# Patient Record
Sex: Male | Born: 1975
Health system: Southern US, Community
[De-identification: ages and names within clinical notes are randomized; demographics above are authoritative.]

## PROBLEM LIST (undated history)

## (undated) DIAGNOSIS — D649 Anemia, unspecified: Secondary | ICD-10-CM

## (undated) DIAGNOSIS — E785 Hyperlipidemia, unspecified: Secondary | ICD-10-CM

## (undated) DIAGNOSIS — D509 Iron deficiency anemia, unspecified: Secondary | ICD-10-CM

## (undated) DIAGNOSIS — I10 Essential (primary) hypertension: Secondary | ICD-10-CM

## (undated) DIAGNOSIS — E119 Type 2 diabetes mellitus without complications: Secondary | ICD-10-CM

## (undated) HISTORY — DX: Iron deficiency anemia, unspecified: D50.9

## (undated) HISTORY — DX: Anemia, unspecified: D64.9

## (undated) HISTORY — DX: Type 2 diabetes mellitus without complications: E11.9

## (undated) HISTORY — DX: Hyperlipidemia, unspecified: E78.5

## (undated) HISTORY — DX: Essential (primary) hypertension: I10

---

## 1997-10-29 ENCOUNTER — Emergency Department (HOSPITAL_COMMUNITY): Admission: EM | Admit: 1997-10-29 | Discharge: 1997-10-29 | Payer: Self-pay | Admitting: Emergency Medicine

## 2006-01-05 ENCOUNTER — Emergency Department (HOSPITAL_COMMUNITY): Admission: EM | Admit: 2006-01-05 | Discharge: 2006-01-06 | Payer: Self-pay | Admitting: Emergency Medicine

## 2006-01-13 ENCOUNTER — Ambulatory Visit (HOSPITAL_BASED_OUTPATIENT_CLINIC_OR_DEPARTMENT_OTHER): Admission: RE | Admit: 2006-01-13 | Discharge: 2006-01-13 | Payer: Self-pay | Admitting: Urology

## 2017-01-19 ENCOUNTER — Ambulatory Visit (INDEPENDENT_AMBULATORY_CARE_PROVIDER_SITE_OTHER): Payer: BLUE CROSS/BLUE SHIELD | Admitting: Family Medicine

## 2017-01-19 ENCOUNTER — Ambulatory Visit (INDEPENDENT_AMBULATORY_CARE_PROVIDER_SITE_OTHER): Payer: BLUE CROSS/BLUE SHIELD

## 2017-01-19 ENCOUNTER — Encounter: Payer: Self-pay | Admitting: Family Medicine

## 2017-01-19 VITALS — BP 138/82 | HR 108 | Temp 99.1°F | Resp 18 | Ht 63.78 in | Wt 157.2 lb

## 2017-01-19 DIAGNOSIS — Z23 Encounter for immunization: Secondary | ICD-10-CM

## 2017-01-19 DIAGNOSIS — E785 Hyperlipidemia, unspecified: Secondary | ICD-10-CM | POA: Diagnosis not present

## 2017-01-19 DIAGNOSIS — R079 Chest pain, unspecified: Secondary | ICD-10-CM

## 2017-01-19 DIAGNOSIS — I517 Cardiomegaly: Secondary | ICD-10-CM | POA: Diagnosis not present

## 2017-01-19 DIAGNOSIS — R011 Cardiac murmur, unspecified: Secondary | ICD-10-CM | POA: Diagnosis not present

## 2017-01-19 DIAGNOSIS — R030 Elevated blood-pressure reading, without diagnosis of hypertension: Secondary | ICD-10-CM | POA: Diagnosis not present

## 2017-01-19 DIAGNOSIS — R9431 Abnormal electrocardiogram [ECG] [EKG]: Secondary | ICD-10-CM

## 2017-01-19 NOTE — Patient Instructions (Addendum)
Walking or other form of low intensity exercise most days of the week with goal of 150 mins per week. If any chest pains with exercise, stop right away and be seen.  Cardiology can also advise you on type and intensity of exercise  Exercise will also help cholesterol, but I will check those levels today.   I hear a faint heart murmur, and there were some slight abnormalities of your EKG, but may be normal for you. I will refer you to a cardiologist to see if other testing is recommended.   Return to the clinic or go to the nearest emergency room if any of your symptoms worsen or new symptoms occur.    Nonspecific Chest Pain Chest pain can be caused by many different conditions. There is always a chance that your pain could be related to something serious, such as a heart attack or a blood clot in your lungs. Chest pain can also be caused by conditions that are not life-threatening. If you have chest pain, it is very important to follow up with your health care provider. What are the causes? Causes of this condition include:  Heartburn.  Pneumonia or bronchitis.  Anxiety or stress.  Inflammation around your heart (pericarditis) or lung (pleuritis or pleurisy).  A blood clot in your lung.  A collapsed lung (pneumothorax). This can develop suddenly on its own (spontaneous pneumothorax) or from trauma to the chest.  Shingles infection (varicella-zoster virus).  Heart attack.  Damage to the bones, muscles, and cartilage that make up your chest wall. This can include: ? Bruised bones due to injury. ? Strained muscles or cartilage due to frequent or repeated coughing or overwork. ? Fracture to one or more ribs. ? Sore cartilage due to inflammation (costochondritis).  What increases the risk? Risk factors for this condition may include:  Activities that increase your risk for trauma or injury to your chest.  Respiratory infections or conditions that cause frequent  coughing.  Medical conditions or overeating that can cause heartburn.  Heart disease or family history of heart disease.  Conditions or health behaviors that increase your risk of developing a blood clot.  Having had chicken pox (varicella zoster).  What are the signs or symptoms? Chest pain can feel like:  Burning or tingling on the surface of your chest or deep in your chest.  Crushing, pressure, aching, or squeezing pain.  Dull or sharp pain that is worse when you move, cough, or take a deep breath.  Pain that is also felt in your back, neck, shoulder, or arm, or pain that spreads to any of these areas.  Your chest pain may come and go, or it may stay constant. How is this diagnosed? Lab tests or other studies may be needed to find the cause of your pain. Your health care provider may have you take a test called an ECG (electrocardiogram). An ECG records your heartbeat patterns at the time the test is performed. You may also have other tests, such as:  Transthoracic echocardiogram (TTE). In this test, sound waves are used to create a picture of the heart structures and to look at how blood flows through your heart.  Transesophageal echocardiogram (TEE).This is a more advanced imaging test that takes images from inside your body. It allows your health care provider to see your heart in finer detail.  Cardiac monitoring. This allows your health care provider to monitor your heart rate and rhythm in real time.  Holter monitor. This is a  portable device that records your heartbeat and can help to diagnose abnormal heartbeats. It allows your health care provider to track your heart activity for several days, if needed.  Stress tests. These can be done through exercise or by taking medicine that makes your heart beat more quickly.  Blood tests.  Other imaging tests.  How is this treated? Treatment depends on what is causing your chest pain. Treatment may include:  Medicines.  These may include: ? Acid blockers for heartburn. ? Anti-inflammatory medicine. ? Pain medicine for inflammatory conditions. ? Antibiotic medicine, if an infection is present. ? Medicines to dissolve blood clots. ? Medicines to treat coronary artery disease (CAD).  Supportive care for conditions that do not require medicines. This may include: ? Resting. ? Applying heat or cold packs to injured areas. ? Limiting activities until pain decreases.  Follow these instructions at home: Medicines  If you were prescribed an antibiotic, take it as told by your health care provider. Do not stop taking the antibiotic even if you start to feel better.  Take over-the-counter and prescription medicines only as told by your health care provider. Lifestyle  Do not use any products that contain nicotine or tobacco, such as cigarettes and e-cigarettes. If you need help quitting, ask your health care provider.  Do not drink alcohol.  Make lifestyle changes as directed by your health care provider. These may include: ? Getting regular exercise. Ask your health care provider to suggest some activities that are safe for you. ? Eating a heart-healthy diet. A registered dietitian can help you to learn healthy eating options. ? Maintaining a healthy weight. ? Managing diabetes, if necessary. ? Reducing stress, such as with yoga or relaxation techniques. General instructions  Avoid any activities that bring on chest pain.  If heartburn is the cause for your chest pain, raise (elevate) the head of your bed about 6 inches (15 cm) by putting blocks under the legs. Sleeping with more pillows does not effectively relieve heartburn because it only changes the position of your head.  Keep all follow-up visits as told by your health care provider. This is important. This includes any further testing if your chest pain does not go away. Contact a health care provider if:  Your chest pain does not go  away.  You have a rash with blisters on your chest.  You have a fever.  You have chills. Get help right away if:  Your chest pain is worse.  You have a cough that gets worse, or you cough up blood.  You have severe pain in your abdomen.  You have severe weakness.  You faint.  You have sudden, unexplained chest discomfort.  You have sudden, unexplained discomfort in your arms, back, neck, or jaw.  You have shortness of breath at any time.  You suddenly start to sweat, or your skin gets clammy.  You feel nauseous or you vomit.  You suddenly feel light-headed or dizzy.  Your heart begins to beat quickly, or it feels like it is skipping beats. These symptoms may represent a serious problem that is an emergency. Do not wait to see if the symptoms will go away. Get medical help right away. Call your local emergency services (911 in the [place] Do not drive yourself to the hospital. This information is not intended to replace advice given to you by your health care provider. Make sure you discuss any questions you have with your health care provider. Document Released: 12/18/2004 Document Revised:  12/03/2015 Document Reviewed: 12/03/2015 Elsevier Interactive Patient Education  2017 [employer]  Heart Murmur A heart murmur is an extra sound that is caused by chaotic blood flow. The murmur can be heard as a "hum" or "whoosh" sound when blood flows through the heart. The heart has four areas called chambers. Valves separate the upper and lower chambers from each other (tricuspid valve and mitral valve) and separate the lower chambers of the heart from pathways that lead away from the heart (aortic valve and pulmonary valve). Normally, the valves open to let blood flow through or out of your heart, and then they shut to keep the blood from flowing backward. There are two types of heart murmurs:  Innocent murmurs. Most people with this type of heart murmur do not have a heart problem.  Many children have innocent heart murmurs. Your health care provider may suggest some basic testing to find out whether your murmur is an innocent murmur. If an innocent heart murmur is found, there is no need for further tests or treatment and no need to restrict activities or stop playing sports.  Abnormal murmurs. These types of murmurs can occur in children and adults. Abnormal murmurs may be a sign of a more serious heart condition, such as a heart defect present at birth (congenital defect) or heart valve disease.  What are the causes? This condition is caused by heart valves that are not working properly. In children, abnormal heart murmurs are typically caused by congenital defects. In adults, abnormal murmurs are usually from heart valve problems caused by disease, infection, or aging. Three types of heart valve defects can cause a murmur:  Regurgitation. This is when blood leaks back through the valve in the wrong direction.  Mitral valve prolapse. This is when the mitral valve of the heart has a loose flap and does not close tightly.  Stenosis. This is when a valve does not open enough and blocks blood flow.  This condition may also be caused by:  Pregnancy.  Fever.  Overactive thyroid gland.  Anemia.  Exercise.  Rapid growth spurts (in children).  What are the signs or symptoms? Innocent murmurs do not cause symptoms, and many people with abnormal murmurs may or may not have symptoms. If symptoms do develop, they may include:  Shortness of breath.  Blue coloring of the skin, especially on the fingertips.  Chest pain.  Palpitations, or feeling a fluttering or skipped heartbeat.  Fainting.  Persistent cough.  Getting tired much faster than expected.  Swelling in the abdomen, feet, or ankles.  How is this diagnosed? This condition may be diagnosed during a routine physical or other exam. If your health care provider hears a murmur with a stethoscope, he or she  will listen for:  Where the murmur is located in your heart.  How long the murmur lasts (duration).  When the murmur is heard during the heartbeat.  How loud the murmur is. This may help the health care provider figure out what is causing the murmur.  You may be referred to a heart specialist (cardiologist). You may also have other tests, including:  Electrocardiogram (ECG or EKG). This test measures the electrical activity of your heart.  Echocardiogram. This test uses high frequency sound waves to make pictures of your heart.  MRI or chest X-ray.  Cardiac catheterization. This test looks at blood flow through the heart.  For children and adults who have an abnormal heart murmur and want to stay active, it is  important to complete testing, review test results, and receive recommendations from your health care provider. If heart disease is present, it may not be safe to play or be active. How is this treated? Heart murmurs themselves do not need treatment. In some cases, a heart murmur may go away on its own. If an underlying problem or disease is causing the murmur, you may need treatment. If treatment is needed, it will depend on the type and severity of the disease or heart problem causing the murmur. Treatment may include:  Medicine.  Surgery.  Dietary and lifestyle changes.  Follow these instructions at home:  Talk with your health care provider before participating in sports or other activities that require a lot of effort and energy (are strenuous).  Learn as much as possible about your condition and any related diseases. Ask your health care provider if you may at risk for any medical emergencies.  Talk with your health care provider about what symptoms you should look out for.  It is up to you to get your test results. Ask your health care provider, or the department that is doing the test, when your results will be ready.  Keep all follow-up visits as told by your  health care provider. This is important. Contact a health care provider if:  You feel light-headed.  You are frequently short of breath.  You feel more tired than usual.  You are having a hard time keeping up with normal activities or fitness routines.  You have swelling in your ankles or feet.  You have chest pain.  You notice that your heart often beats irregularly.  You develop any new symptoms. Get help right away if:  You develop severe chest pain.  You are having trouble breathing.  You have fainting spells.  Your symptoms suddenly get worse. These symptoms may represent a serious problem that is an emergency. Do not wait to see if the symptoms will go away. Get medical help right away. Call your local emergency services (911 in the [place] Do not drive yourself to the hospital. Summary  Normally, the heart valves open to let blood flow through or out of your heart, and then they shut to keep the blood from flowing backward.  Heart murmur is caused by heart valves that are not working properly.  You may need treatment if an underlying problem or disease is causing the heart murmur. Treatment may include medicine, surgery, or dietary and lifestyle changes.  Talk with your health care provider before participating in sports or other activities that require a lot of effort and energy (are strenuous).  Talk with your health care provider about what symptoms you should watch out for. This information is not intended to replace advice given to you by your health care provider. Make sure you discuss any questions you have with your health care provider. Document Released: 04/17/2004 Document Revised: 02/27/2016 Document Reviewed: 02/27/2016 Elsevier Interactive Patient Education  2017 [employer]    IF you received an x-ray today, you will receive an invoice from [facility]. Please contact [facility] at [phone] with questions or concerns  regarding your invoice.   IF you received labwork today, you will receive an invoice from [facility]. Please contact [facility] at [phone] with questions or concerns regarding your invoice.   Our billing staff will not be able to assist you with questions regarding bills from these companies.  You will be contacted with the lab results as soon as they are available.  The fastest way to get your results is to activate your My Chart account. Instructions are located on the last page of this paperwork. If you have not heard from [place] regarding the results in 2 weeks, please contact this office.

## 2017-01-19 NOTE — Progress Notes (Signed)
Subjective:  By signing my name below, I, Essence Howell, attest that this documentation has been prepared under the direction and in the presence of Shade FloodJeffrey R Rhilyn Battle, MD Electronically Signed: Charline BillsEssence Howell, ED Scribe 01/19/2017 at 1:41 PM.   Patient ID: John Obrien, male    DOB: [birth year], 41 y.o.   MRN: [id number]  Chief Complaint  Patient presents with  . Establish Care   HPI John Obrien is a 41 y.o. male who presents to Primary Care at [facility] to establish care. Pt had a biometric screening at work in May with borderline cholesterol and BP; no previous treatment. Reports HAs once every 2 weeks which resolve with 2 ibuprofen within 2 hours. Does report consuming frozen foods most days for lunch. Reports consuming 3-4 mixed alcoholic beverages every other weekend. He is only getting exercise during 12 hour shifts at work but states he was walking for exercise in the Spring. Pt does report rare sharp pain in his central left chest with exercising that self-resolves but denies a family h/o heart disease. Denies cp at this time, light-headedness, dizziness. H/o kidney stone removal in 2007. No long-term medications.   Immunizations Declines flu vaccine. Last tetanus was in 2007 but plans to update it today.   HIV screening in September 2007, non-reactive. Denies new unprotected sexual partners since.  There are no active problems to display for this patient.  No past medical history on file. No past surgical history on file. No Known Allergies Prior to Admission medications   Not on File   Social History   Social History  . Marital status: Single    Spouse name: N/A  . Number of children: N/A  . Years of education: N/A   Occupational History  . Not on file.   Social History Main Topics  . Smoking status: Never Smoker  . Smokeless tobacco: Never Used  . Alcohol use Yes     Comment: sometimes   . Drug use: Unknown  . Sexual activity: Not on file   Other Topics  Concern  . Not on file   Social History Narrative  . No narrative on file   Review of Systems  Constitutional: Negative for fatigue and unexpected weight change.  Eyes: Negative for visual disturbance.  Respiratory: Negative for cough, chest tightness and shortness of breath.   Cardiovascular: Negative for chest pain, palpitations and leg swelling.  Gastrointestinal: Negative for abdominal pain and blood in stool.  Neurological: Positive for headaches. Negative for dizziness and light-headedness.      Objective:   Physical Exam  Constitutional: He is oriented to person, place, and time. He appears well-developed and well-nourished.  HENT:  Head: Normocephalic and atraumatic.  Eyes: Pupils are equal, round, and reactive to light. EOM are normal.  Neck: No JVD present. Carotid bruit is not present.  Cardiovascular: Normal rate and regular rhythm.   Murmur heard.  Systolic (L upper sternal border) murmur is present with a grade of 2/6  Pulmonary/Chest: Effort normal and breath sounds normal. He has no rales.  Musculoskeletal: He exhibits no edema.  Neurological: He is alert and oriented to person, place, and time.  Skin: Skin is warm and dry.  Psychiatric: He has a normal mood and affect.  Vitals reviewed.  Vitals:   01/19/17 1333  BP: 138/82  Pulse: (!) 108  Resp: 18  Temp: 99.1 F (37.3 C)  TempSrc: Oral  SpO2: 100%  Weight: 157 lb 3.2 oz (71.3 kg)  Height: 5'  3.78" (1.62 m)   Dg Chest 2 View  Result Date: 01/19/2017 CLINICAL DATA:  41 y/o  M; heart murmur, rule out cardiomegaly. EXAM: CHEST  2 VIEW COMPARISON:  None. FINDINGS: The heart size and mediastinal contours are within normal limits. Both lungs are clear. The visualized skeletal structures are unremarkable. IMPRESSION: Normal chest radiograph.  No cardiomegaly. Electronically Signed   By: Mitzi Hansen M.D.   On: 01/19/2017 14:36   EKG reading done by Shade Flood, MD: sinus rhythm nonspecific  and inverted T-waves V3-V6. No apparent acute findings otherwise. No prior EKG for comparison.     Assessment & Plan:   John Obrien is a 41 y.o. male Hyperlipidemia, unspecified hyperlipidemia type - Plan: Comprehensive metabolic panel, Lipid panel  - Reportedly mild hyperlipidemia on health screening. Specific numbers not available, will check lipid panel in office. If elevated, may need to repeat after 8 hours of fasting  Elevated blood pressure reading  -By history, but high normal in office. Continue to monitor outside of office and if remaining over 140/90, would consider medication  Need for Tdap vaccination - Plan: Tdap vaccine greater than or equal to 7yo IM  Heart murmur - Plan: EKG 12-Lead, DG Chest 2 View, Ambulatory referral to Cardiology Nonspecific abnormal electrocardiogram (ECG) (EKG) - Plan: DG Chest 2 View, Ambulatory referral to CardiologyChest pain, unspecified type - Plan: EKG 12-Lead, DG Chest 2 View, Ambulatory referral to Cardiology  -Fleeting sharp chest pains, unlikely cardiac. Suspected MSK source. However, with borderline abnormal EKG, heart murmur, will refer to cardiology for further evaluation. ER/RTC precautions for chest pain and handout given.  No orders of the defined types were placed in this encounter.  Patient Instructions   Walking or other form of low intensity exercise most days of the week with goal of 150 mins per week. If any chest pains with exercise, stop right away and be seen.  Cardiology can also advise you on type and intensity of exercise  Exercise will also help cholesterol, but I will check those levels today.   I hear a faint heart murmur, and there were some slight abnormalities of your EKG, but may be normal for you. I will refer you to a cardiologist to see if other testing is recommended.   Return to the clinic or go to the nearest emergency room if any of your symptoms worsen or new symptoms occur.    Nonspecific Chest  Pain Chest pain can be caused by many different conditions. There is always a chance that your pain could be related to something serious, such as a heart attack or a blood clot in your lungs. Chest pain can also be caused by conditions that are not life-threatening. If you have chest pain, it is very important to follow up with your health care provider. What are the causes? Causes of this condition include:  Heartburn.  Pneumonia or bronchitis.  Anxiety or stress.  Inflammation around your heart (pericarditis) or lung (pleuritis or pleurisy).  A blood clot in your lung.  A collapsed lung (pneumothorax). This can develop suddenly on its own (spontaneous pneumothorax) or from trauma to the chest.  Shingles infection (varicella-zoster virus).  Heart attack.  Damage to the bones, muscles, and cartilage that make up your chest wall. This can include: ? Bruised bones due to injury. ? Strained muscles or cartilage due to frequent or repeated coughing or overwork. ? Fracture to one or more ribs. ? Sore cartilage due to inflammation (costochondritis).  What increases the risk? Risk factors for this condition may include:  Activities that increase your risk for trauma or injury to your chest.  Respiratory infections or conditions that cause frequent coughing.  Medical conditions or overeating that can cause heartburn.  Heart disease or family history of heart disease.  Conditions or health behaviors that increase your risk of developing a blood clot.  Having had chicken pox (varicella zoster).  What are the signs or symptoms? Chest pain can feel like:  Burning or tingling on the surface of your chest or deep in your chest.  Crushing, pressure, aching, or squeezing pain.  Dull or sharp pain that is worse when you move, cough, or take a deep breath.  Pain that is also felt in your back, neck, shoulder, or arm, or pain that spreads to any of these areas.  Your chest pain may  come and go, or it may stay constant. How is this diagnosed? Lab tests or other studies may be needed to find the cause of your pain. Your health care provider may have you take a test called an ECG (electrocardiogram). An ECG records your heartbeat patterns at the time the test is performed. You may also have other tests, such as:  Transthoracic echocardiogram (TTE). In this test, sound waves are used to create a picture of the heart structures and to look at how blood flows through your heart.  Transesophageal echocardiogram (TEE).This is a more advanced imaging test that takes images from inside your body. It allows your health care provider to see your heart in finer detail.  Cardiac monitoring. This allows your health care provider to monitor your heart rate and rhythm in real time.  Holter monitor. This is a portable device that records your heartbeat and can help to diagnose abnormal heartbeats. It allows your health care provider to track your heart activity for several days, if needed.  Stress tests. These can be done through exercise or by taking medicine that makes your heart beat more quickly.  Blood tests.  Other imaging tests.  How is this treated? Treatment depends on what is causing your chest pain. Treatment may include:  Medicines. These may include: ? Acid blockers for heartburn. ? Anti-inflammatory medicine. ? Pain medicine for inflammatory conditions. ? Antibiotic medicine, if an infection is present. ? Medicines to dissolve blood clots. ? Medicines to treat coronary artery disease (CAD).  Supportive care for conditions that do not require medicines. This may include: ? Resting. ? Applying heat or cold packs to injured areas. ? Limiting activities until pain decreases.  Follow these instructions at home: Medicines  If you were prescribed an antibiotic, take it as told by your health care provider. Do not stop taking the antibiotic even if you start to feel  better.  Take over-the-counter and prescription medicines only as told by your health care provider. Lifestyle  Do not use any products that contain nicotine or tobacco, such as cigarettes and e-cigarettes. If you need help quitting, ask your health care provider.  Do not drink alcohol.  Make lifestyle changes as directed by your health care provider. These may include: ? Getting regular exercise. Ask your health care provider to suggest some activities that are safe for you. ? Eating a heart-healthy diet. A registered dietitian can help you to learn healthy eating options. ? Maintaining a healthy weight. ? Managing diabetes, if necessary. ? Reducing stress, such as with yoga or relaxation techniques. General instructions  Avoid any activities that bring  on chest pain.  If heartburn is the cause for your chest pain, raise (elevate) the head of your bed about 6 inches (15 cm) by putting blocks under the legs. Sleeping with more pillows does not effectively relieve heartburn because it only changes the position of your head.  Keep all follow-up visits as told by your health care provider. This is important. This includes any further testing if your chest pain does not go away. Contact a health care provider if:  Your chest pain does not go away.  You have a rash with blisters on your chest.  You have a fever.  You have chills. Get help right away if:  Your chest pain is worse.  You have a cough that gets worse, or you cough up blood.  You have severe pain in your abdomen.  You have severe weakness.  You faint.  You have sudden, unexplained chest discomfort.  You have sudden, unexplained discomfort in your arms, back, neck, or jaw.  You have shortness of breath at any time.  You suddenly start to sweat, or your skin gets clammy.  You feel nauseous or you vomit.  You suddenly feel light-headed or dizzy.  Your heart begins to beat quickly, or it feels like it is  skipping beats. These symptoms may represent a serious problem that is an emergency. Do not wait to see if the symptoms will go away. Get medical help right away. Call your local emergency services (911 in the [place] Do not drive yourself to the hospital. This information is not intended to replace advice given to you by your health care provider. Make sure you discuss any questions you have with your health care provider. Document Released: 12/18/2004 Document Revised: 12/03/2015 Document Reviewed: 12/03/2015 Elsevier Interactive Patient Education  2017 [employer] Inc[employer]  Heart Murmur A heart murmur is an extra sound that is caused by chaotic blood flow. The murmur can be heard as a "hum" or "whoosh" sound when blood flows through the heart. The heart has four areas called chambers. Valves separate the upper and lower chambers from each other (tricuspid valve and mitral valve) and separate the lower chambers of the heart from pathways that lead away from the heart (aortic valve and pulmonary valve). Normally, the valves open to let blood flow through or out of your heart, and then they shut to keep the blood from flowing backward. There are two types of heart murmurs:  Innocent murmurs. Most people with this type of heart murmur do not have a heart problem. Many children have innocent heart murmurs. Your health care provider may suggest some basic testing to find out whether your murmur is an innocent murmur. If an innocent heart murmur is found, there is no need for further tests or treatment and no need to restrict activities or stop playing sports.  Abnormal murmurs. These types of murmurs can occur in children and adults. Abnormal murmurs may be a sign of a more serious heart condition, such as a heart defect present at birth (congenital defect) or heart valve disease.  What are the causes? This condition is caused by heart valves that are not working properly. In children, abnormal heart murmurs  are typically caused by congenital defects. In adults, abnormal murmurs are usually from heart valve problems caused by disease, infection, or aging. Three types of heart valve defects can cause a murmur:  Regurgitation. This is when blood leaks back through the valve in the wrong direction.  Mitral valve prolapse. This  is when the mitral valve of the heart has a loose flap and does not close tightly.  Stenosis. This is when a valve does not open enough and blocks blood flow.  This condition may also be caused by:  Pregnancy.  Fever.  Overactive thyroid gland.  Anemia.  Exercise.  Rapid growth spurts (in children).  What are the signs or symptoms? Innocent murmurs do not cause symptoms, and many people with abnormal murmurs may or may not have symptoms. If symptoms do develop, they may include:  Shortness of breath.  Blue coloring of the skin, especially on the fingertips.  Chest pain.  Palpitations, or feeling a fluttering or skipped heartbeat.  Fainting.  Persistent cough.  Getting tired much faster than expected.  Swelling in the abdomen, feet, or ankles.  How is this diagnosed? This condition may be diagnosed during a routine physical or other exam. If your health care provider hears a murmur with a stethoscope, he or she will listen for:  Where the murmur is located in your heart.  How long the murmur lasts (duration).  When the murmur is heard during the heartbeat.  How loud the murmur is. This may help the health care provider figure out what is causing the murmur.  You may be referred to a heart specialist (cardiologist). You may also have other tests, including:  Electrocardiogram (ECG or EKG). This test measures the electrical activity of your heart.  Echocardiogram. This test uses high frequency sound waves to make pictures of your heart.  MRI or chest X-ray.  Cardiac catheterization. This test looks at blood flow through the heart.  For  children and adults who have an abnormal heart murmur and want to stay active, it is important to complete testing, review test results, and receive recommendations from your health care provider. If heart disease is present, it may not be safe to play or be active. How is this treated? Heart murmurs themselves do not need treatment. In some cases, a heart murmur may go away on its own. If an underlying problem or disease is causing the murmur, you may need treatment. If treatment is needed, it will depend on the type and severity of the disease or heart problem causing the murmur. Treatment may include:  Medicine.  Surgery.  Dietary and lifestyle changes.  Follow these instructions at home:  Talk with your health care provider before participating in sports or other activities that require a lot of effort and energy (are strenuous).  Learn as much as possible about your condition and any related diseases. Ask your health care provider if you may at risk for any medical emergencies.  Talk with your health care provider about what symptoms you should look out for.  It is up to you to get your test results. Ask your health care provider, or the department that is doing the test, when your results will be ready.  Keep all follow-up visits as told by your health care provider. This is important. Contact a health care provider if:  You feel light-headed.  You are frequently short of breath.  You feel more tired than usual.  You are having a hard time keeping up with normal activities or fitness routines.  You have swelling in your ankles or feet.  You have chest pain.  You notice that your heart often beats irregularly.  You develop any new symptoms. Get help right away if:  You develop severe chest pain.  You are having trouble breathing.  You have fainting spells.  Your symptoms suddenly get worse. These symptoms may represent a serious problem that is an emergency. Do not  wait to see if the symptoms will go away. Get medical help right away. Call your local emergency services (911 in the [place] Do not drive yourself to the hospital. Summary  Normally, the heart valves open to let blood flow through or out of your heart, and then they shut to keep the blood from flowing backward.  Heart murmur is caused by heart valves that are not working properly.  You may need treatment if an underlying problem or disease is causing the heart murmur. Treatment may include medicine, surgery, or dietary and lifestyle changes.  Talk with your health care provider before participating in sports or other activities that require a lot of effort and energy (are strenuous).  Talk with your health care provider about what symptoms you should watch out for. This information is not intended to replace advice given to you by your health care provider. Make sure you discuss any questions you have with your health care provider. Document Released: 04/17/2004 Document Revised: 02/27/2016 Document Reviewed: 02/27/2016 Elsevier Interactive Patient Education  2017 [employer].    IF you received an x-ray today, you will receive an invoice from [facility]. Please contact [facility] at [phone] with questions or concerns regarding your invoice.   IF you received labwork today, you will receive an invoice from [place]. Please contact [facility] at [phone] with questions or concerns regarding your invoice.   Our billing staff will not be able to assist you with questions regarding bills from these companies.  You will be contacted with the lab results as soon as they are available. The fastest way to get your results is to activate your My Chart account. Instructions are located on the last page of this paperwork. If you have not heard from [place] regarding the results in 2 weeks, please contact this office.      I personally performed the services described in this  documentation, which was scribed in my presence. The recorded information has been reviewed and considered for accuracy and completeness, addended by me as needed, and agree with information above.  Signed,   Meredith Staggers, MD Primary Care at [facility].  01/19/17 7:21 PM

## 2017-01-20 LAB — COMPREHENSIVE METABOLIC PANEL
A/G RATIO: 1.5 (ref 1.2–2.2)
ALBUMIN: 4.7 g/dL (ref 3.5–5.5)
ALK PHOS: 92 IU/L (ref 39–117)
ALT: 16 IU/L (ref 0–44)
AST: 50 IU/L — AB (ref 0–40)
BUN / CREAT RATIO: 10 (ref 9–20)
BUN: 9 mg/dL (ref 6–24)
CO2: 21 mmol/L (ref 20–29)
Calcium: 9.2 mg/dL (ref 8.7–10.2)
Chloride: 102 mmol/L (ref 96–106)
Creatinine, Ser: 0.87 mg/dL (ref 0.76–1.27)
GFR calc Af Amer: 122 mL/min/{1.73_m2} (ref >59)
GFR, EST NON AFRICAN AMERICAN: 106 mL/min/{1.73_m2} (ref >59)
GLOBULIN, TOTAL: 3.2 g/dL (ref 1.5–4.5)
Glucose: 130 mg/dL — ABNORMAL HIGH (ref 65–99)
POTASSIUM: 5.2 mmol/L (ref 3.5–5.2)
SODIUM: 141 mmol/L (ref 134–144)
Total Protein: 7.9 g/dL (ref 6.0–8.5)

## 2017-01-20 LAB — LIPID PANEL
CHOL/HDL RATIO: 8.1 ratio — AB (ref 0.0–5.0)
CHOLESTEROL TOTAL: 218 mg/dL — AB (ref 100–199)
HDL: 27 mg/dL — ABNORMAL LOW (ref >39)
LDL Calculated: 148 mg/dL — ABNORMAL HIGH (ref 0–99)
TRIGLYCERIDES: 217 mg/dL — AB (ref 0–149)
VLDL Cholesterol Cal: 43 mg/dL — ABNORMAL HIGH (ref 5–40)

## 2017-01-25 ENCOUNTER — Other Ambulatory Visit: Payer: Self-pay | Admitting: Family Medicine

## 2017-01-25 DIAGNOSIS — E785 Hyperlipidemia, unspecified: Secondary | ICD-10-CM

## 2017-01-25 DIAGNOSIS — R739 Hyperglycemia, unspecified: Secondary | ICD-10-CM

## 2017-01-25 NOTE — Progress Notes (Signed)
See lab notes. Hyperglycemia, hyperlipidemia, but was not fasting for 8 hours. Return for repeat testing

## 2017-12-11 ENCOUNTER — Telehealth: Payer: Self-pay | Admitting: Family Medicine

## 2017-12-11 NOTE — Telephone Encounter (Signed)
LVM for pt to call back with more information regarding his appt.  If he is truly having a CPE on 12/18/17, he must reschedule.  Next availability is October 8th in the afternoon for now.

## 2017-12-18 ENCOUNTER — Ambulatory Visit: Payer: BLUE CROSS/BLUE SHIELD | Admitting: Family Medicine

## 2018-01-05 ENCOUNTER — Ambulatory Visit (INDEPENDENT_AMBULATORY_CARE_PROVIDER_SITE_OTHER): Payer: BLUE CROSS/BLUE SHIELD | Admitting: Family Medicine

## 2018-01-05 ENCOUNTER — Encounter: Payer: Self-pay | Admitting: Family Medicine

## 2018-01-05 ENCOUNTER — Other Ambulatory Visit: Payer: Self-pay

## 2018-01-05 VITALS — BP 162/92 | HR 102 | Temp 99.1°F | Ht 64.0 in | Wt 153.4 lb

## 2018-01-05 DIAGNOSIS — I10 Essential (primary) hypertension: Secondary | ICD-10-CM | POA: Diagnosis not present

## 2018-01-05 DIAGNOSIS — Z0000 Encounter for general adult medical examination without abnormal findings: Secondary | ICD-10-CM

## 2018-01-05 DIAGNOSIS — Z113 Encounter for screening for infections with a predominantly sexual mode of transmission: Secondary | ICD-10-CM | POA: Diagnosis not present

## 2018-01-05 DIAGNOSIS — Z0001 Encounter for general adult medical examination with abnormal findings: Secondary | ICD-10-CM

## 2018-01-05 DIAGNOSIS — R739 Hyperglycemia, unspecified: Secondary | ICD-10-CM | POA: Diagnosis not present

## 2018-01-05 DIAGNOSIS — E785 Hyperlipidemia, unspecified: Secondary | ICD-10-CM

## 2018-01-05 DIAGNOSIS — R079 Chest pain, unspecified: Secondary | ICD-10-CM

## 2018-01-05 DIAGNOSIS — R011 Cardiac murmur, unspecified: Secondary | ICD-10-CM | POA: Diagnosis not present

## 2018-01-05 MED ORDER — AMLODIPINE BESYLATE 5 MG PO TABS: 5.0000 mg | ORAL_TABLET | Freq: Every day | ORAL | 1 refills | Status: DC

## 2018-01-05 NOTE — Patient Instructions (Addendum)
I will check labs today, but may need to repeat after 8 hours fasting.  Blood pressure elevated again - start amlodipine once per day and recheck in 1 month.   I do want you to meet with cardiology for chest pains, and heart murmur. Return to the clinic or go to the nearest emergency room if any of your symptoms worsen or new symptoms occur.  Cut back to no more than 1-2 alcoholic drinks per day at the most.   Thanks for coming in today.    Keeping you healthy  Get these tests  Blood pressure- Have your blood pressure checked once a year by your healthcare provider.  Normal blood pressure is 120/80.  Weight- Have your body mass index (BMI) calculated to screen for obesity.  BMI is a measure of body fat based on height and weight. You can also calculate your own BMI at https://www.west-esparza.com/.  Cholesterol- Have your cholesterol checked regularly starting at age 16, sooner may be necessary if you have diabetes, high blood pressure, if a family member developed heart diseases at an early age or if you smoke.   Chlamydia, HIV, and other sexual transmitted disease- Get screened each year until the age of 5 then within three months of each new sexual partner.  Diabetes- Have your blood sugar checked regularly if you have high blood pressure, high cholesterol, a family history of diabetes or if you are overweight.  Get these vaccines  Flu shot- Every fall.  Tetanus shot- Every 10 years.  Menactra- Single dose; prevents meningitis.  Take these steps  Don't smoke- If you do smoke, ask your healthcare provider about quitting. For tips on how to quit, go to www.smokefree.gov or call 1-800-QUIT-NOW.  Be physically active- Exercise 5 days a week for at least 30 minutes.  If you are not already physically active start slow and gradually work up to 30 minutes of moderate physical activity.  Examples of moderate activity include walking briskly, mowing the yard, dancing, swimming bicycling,  etc.  Eat a healthy diet- Eat a variety of healthy foods such as fruits, vegetables, low fat milk, low fat cheese, yogurt, lean meats, poultry, fish, beans, tofu, etc.  For more information on healthy eating, go to www.thenutritionsource.org  Drink alcohol in moderation- Limit alcohol intake two drinks or less a day.  Never drink and drive.  Dentist- Brush and floss teeth twice daily; visit your dentis twice a year.  Depression-Your emotional health is as important as your physical health.  If you're feeling down, losing interest in things you normally enjoy please talk with your healthcare provider.  Gun Safety- If you keep a gun in your home, keep it unloaded and with the safety lock on.  Bullets should be stored separately.  Helmet use- Always wear a helmet when riding a motorcycle, bicycle, rollerblading or skateboarding.  Safe sex- If you may be exposed to a sexually transmitted infection, use a condom  Seat belts- Seat bels can save your life; always wear one.  Smoke/Carbon Monoxide detectors- These detectors need to be installed on the appropriate level of your home.  Replace batteries at least once a year.  Skin Cancer- When out in the sun, cover up and use sunscreen SPF 15 or higher.  Violence- If anyone is threatening or hurting you, please tell your healthcare provider.  If you have lab work done today you will be contacted with your lab results within the next 2 weeks.  If you have not heard from  [place] then please contact us. The fastest way to get your results is to register for My Chart.   IF you received an x-ray today, you will receive an invoice from [facility]. Please contact [facility] at [phone] with questions or concerns regarding your invoice.   IF you received labwork today, you will receive an invoice from [facility]. Please contact [facility] at [phone] with questions or concerns regarding your invoice.   Our billing staff will not be  able to assist you with questions regarding bills from these companies.  You will be contacted with the lab results as soon as they are available. The fastest way to get your results is to activate your My Chart account. Instructions are located on the last page of this paperwork. If you have not heard from [place] regarding the results in 2 weeks, please contact this office.

## 2018-01-05 NOTE — Progress Notes (Signed)
Subjective:    Patient ID: John Obrien, male    DOB: 06-18-1975, 42 y.o.   MRN: [id number]  HPI Giuseppe E Wisinski is a 42 y.o. male Presents today for: Chief Complaint  Patient presents with  . Annual Exam    CPE  Last seen in October 2018 to establish care.  Elevated blood pressure reading at that time, but high normal.  Monitor outside of office and RTC precautions were discussed.  Also noted heart murmur and referred to cardiology.  Multiple attempts to reach patient without response, referral was closed.  Hyperlipidemia: Labs in 2018.  Recommended repeat testing with fasting lab only visit, but those have not been performed. Lab Results  Component Value Date   CHOL 218 (H) 01/19/2017   HDL 27 (L) 01/19/2017   LDLCALC 148 (H) 01/19/2017   TRIG 217 (H) 01/19/2017   CHOLHDL 8.1 (H) 01/19/2017   Lab Results  Component Value Date   ALT 16 01/19/2017   AST 50 (H) 01/19/2017   ALKPHOS 92 01/19/2017   BILITOT <0.2 01/19/2017  The 10-year ASCVD risk score Denman George DC Jr., et al., 2013) is: 7.3%   Values used to calculate the score:     Age: 37 years     Sex: Male     Is Non-Hispanic African American: Yes     Diabetic: No     Tobacco smoker: No     Systolic Blood Pressure: 162 mmHg     Is BP treated: No     HDL Cholesterol: 27 mg/dL     Total Cholesterol: 218 mg/dL Banana and water this morning about an hour ago.  Hyperglycemia: Blood sugar 130 on October 2018 screening.  May not have been fasting at that time, recommend a repeat testing, has not yet been performed. FH of DM (mom,sister) and heart disease (irregular heartbeat) in mom, father with CVA x2.   Hypertension/elevated BP.  As above, recommended follow-up if elevated outside blood pressure readings.  Also referred to cardiology for heart murmur last year, has not yet seen them. BP was too high at dentist a few months ago to intervene - unknown number. Episodic sharp chest pain that resolves after a few minutes.  Unknown timing - not with exertion.  No current CP and no change in frequency. 2-3 alcoholic drinks 3 days per week (off days). No DUI, no work or family issues with Etoh.  denies addiction.   BP Readings from Last 3 Encounters:  01/05/18 (!) 162/92  01/19/17 138/82   Lab Results  Component Value Date   CREATININE 0.87 01/19/2017   STI: testing: Single, same partner but unprotected. Agrees to screening. No symptoms.   Immunizations: Immunization History  Administered Date(s) Administered  . Tdap 01/19/2017  Flu vaccine recommended.  Declines.    Depression screening: Depression screen [facility] 2/9 01/05/2018 01/19/2017  Decreased Interest 0 0  Down, Depressed, Hopeless 0 0  PHQ - 2 Score 0 0   Vision screen:  Visual Acuity Screening   Right eye Left eye Both eyes  Without correction:     With correction: 20/25 20/25 20/25    Dentist: every 6 months.   Exercise: On days off calisthenics few days per week and walking.     There are no active problems to display for this patient.  No past medical history on file.  No Known Allergies Prior to Admission medications   Not on File   Social History   Socioeconomic History  . Marital status:  Single    Spouse name: Not on file  . Number of children: Not on file  . Years of education: Not on file  . Highest education level: Not on file  Occupational History  . Not on file  Social Needs  . Financial resource strain: Not on file  . Food insecurity:    Worry: Not on file    Inability: Not on file  . Transportation needs:    Medical: Not on file    Non-medical: Not on file  Tobacco Use  . Smoking status: Never Smoker  . Smokeless tobacco: Never Used  Substance and Sexual Activity  . Alcohol use: Yes    Comment: sometimes   . Drug use: Not on file  . Sexual activity: Not on file  Lifestyle  . Physical activity:    Days per week: Not on file    Minutes per session: Not on file  . Stress: Not on file    Relationships  . Social connections:    Talks on phone: Not on file    Gets together: Not on file    Attends religious service: Not on file    Active member of club or organization: Not on file    Attends meetings of clubs or organizations: Not on file    Relationship status: Not on file  . Intimate partner violence:    Fear of current or ex partner: Not on file    Emotionally abused: Not on file    Physically abused: Not on file    Forced sexual activity: Not on file  Other Topics Concern  . Not on file  Social History Narrative  . Not on file    Review of Systems  Constitutional: Negative for fatigue and unexpected weight change.  Eyes: Negative for visual disturbance.  Respiratory: Negative for cough, chest tightness and shortness of breath.   Cardiovascular: Negative for chest pain, palpitations and leg swelling.  Gastrointestinal: Negative for abdominal pain and blood in stool.  Neurological: Negative for dizziness, light-headedness and headaches.   13 point review of systems per patient health survey noted.  Negative other than as indicated above or in HPI.      Objective:   Physical Exam  Constitutional: He is oriented to person, place, and time. He appears well-developed and well-nourished.  HENT:  Head: Normocephalic and atraumatic.  Right Ear: External ear normal.  Left Ear: External ear normal.  Mouth/Throat: Oropharynx is clear and moist.  Eyes: Pupils are equal, round, and reactive to light. Conjunctivae and EOM are normal.  Neck: Normal range of motion. Neck supple. No thyromegaly present.  Cardiovascular: Normal rate, regular rhythm and intact distal pulses.  Murmur heard.  Systolic murmur is present with a grade of 3/6. Pulmonary/Chest: Effort normal and breath sounds normal. No respiratory distress. He has no wheezes.  Abdominal: Soft. He exhibits no distension. There is no tenderness.  Musculoskeletal: Normal range of motion. He exhibits no edema or  tenderness.  Lymphadenopathy:    He has no cervical adenopathy.  Neurological: He is alert and oriented to person, place, and time. He has normal reflexes.  Skin: Skin is warm and dry.  Psychiatric: He has a normal mood and affect. His behavior is normal.  Vitals reviewed.   Vitals:   01/05/18 0954 01/05/18 0955 01/05/18 1005  BP: (!) 182/120 (!) 180/90 (!) 162/92  Pulse: (!) 116  (!) 102  Temp: 99.1 F (37.3 C)    TempSrc: Oral    SpO2:  99%    Weight: 153 lb 6.4 oz (69.6 kg)    Height: 5\' 4"  (1.626 m)         Assessment & Plan:   Carliss E Seybold is a 42 y.o. male Annual physical exam  - -anticipatory guidance as below in AVS, screening labs above. Health maintenance items as above in HPI discussed/recommended as applicable.   Essential hypertension - Plan: Ambulatory referral to Cardiology, amLODipine (NORVASC) 5 MG tablet  -Multiple other injuries including outside of office.  Start amlodipine 5 mg daily, potential higher dose may be needed, recheck in 1 month.  Labs pending.  Heart murmur - Plan: Ambulatory referral to Cardiology Chest pain, unspecified type   -Stressed importance of follow-up with cardiology, as needed echocardiogram, further testing.  Additionally ER/RTC precautions given if any worsening/new chest pains.   Hyperglycemia - Plan: Hemoglobin A1c to [place] for diabetes.   Hyperlipidemia, unspecified hyperlipidemia type - Plan: Comprehensive metabolic panel, Lipid panel  -Borderline ASCVD risk prior.  Repeat labs.  Recheck in 1 month to discuss results.   Meds ordered this encounter  Medications  . amLODipine (NORVASC) 5 MG tablet    Sig: Take 1 tablet (5 mg total) by mouth daily.    Dispense:  90 tablet    Refill:  1   Patient Instructions   I will check labs today, but may need to repeat after 8 hours fasting.  Blood pressure elevated again - start amlodipine once per day and recheck in 1 month.   I do want you to meet with cardiology for chest  pains, and heart murmur. Return to the clinic or go to the nearest emergency room if any of your symptoms worsen or new symptoms occur.   Cut back to no more than 1-2 alcoholic drinks per day at the most.    Keeping you healthy  Get these tests  Blood pressure- Have your blood pressure checked once a year by your healthcare provider.  Normal blood pressure is 120/80.  Weight- Have your body mass index (BMI) calculated to screen for obesity.  BMI is a measure of body fat based on height and weight. You can also calculate your own BMI at https://www.west-esparza.com/.  Cholesterol- Have your cholesterol checked regularly starting at age 29, sooner may be necessary if you have diabetes, high blood pressure, if a family member developed heart diseases at an early age or if you smoke.   Chlamydia, HIV, and other sexual transmitted disease- Get screened each year until the age of 22 then within three months of each new sexual partner.  Diabetes- Have your blood sugar checked regularly if you have high blood pressure, high cholesterol, a family history of diabetes or if you are overweight.  Get these vaccines  Flu shot- Every fall.  Tetanus shot- Every 10 years.  Menactra- Single dose; prevents meningitis.  Take these steps  Don't smoke- If you do smoke, ask your healthcare provider about quitting. For tips on how to quit, go to www.smokefree.gov or call 1-800-QUIT-NOW.  Be physically active- Exercise 5 days a week for at least 30 minutes.  If you are not already physically active start slow and gradually work up to 30 minutes of moderate physical activity.  Examples of moderate activity include walking briskly, mowing the yard, dancing, swimming bicycling, etc.  Eat a healthy diet- Eat a variety of healthy foods such as fruits, vegetables, low fat milk, low fat cheese, yogurt, lean meats, poultry, fish, beans, tofu, etc.  For  more information on healthy eating, go to  www.thenutritionsource.org  Drink alcohol in moderation- Limit alcohol intake two drinks or less a day.  Never drink and drive.  Dentist- Brush and floss teeth twice daily; visit your dentis twice a year.  Depression-Your emotional health is as important as your physical health.  If you're feeling down, losing interest in things you normally enjoy please talk with your healthcare provider.  Gun Safety- If you keep a gun in your home, keep it unloaded and with the safety lock on.  Bullets should be stored separately.  Helmet use- Always wear a helmet when riding a motorcycle, bicycle, rollerblading or skateboarding.  Safe sex- If you may be exposed to a sexually transmitted infection, use a condom  Seat belts- Seat bels can save your life; always wear one.  Smoke/Carbon Monoxide detectors- These detectors need to be installed on the appropriate level of your home.  Replace batteries at least once a year.  Skin Cancer- When out in the sun, cover up and use sunscreen SPF 15 or higher.  Violence- If anyone is threatening or hurting you, please tell your healthcare provider.  If you have lab work done today you will be contacted with your lab results within the next 2 weeks.  If you have not heard from [place] then please contact us. The fastest way to get your results is to register for My Chart.   IF you received an x-ray today, you will receive an invoice from [facility]. Please contact [facility] at [phone] with questions or concerns regarding your invoice.   IF you received labwork today, you will receive an invoice from [facility]. Please contact [facility] at [phone] with questions or concerns regarding your invoice.   Our billing staff will not be able to assist you with questions regarding bills from these companies.  You will be contacted with the lab results as soon as they are available. The fastest way to get your results is to activate your My Chart  account. Instructions are located on the last page of this paperwork. If you have not heard from [place] regarding the results in 2 weeks, please contact this office.       I personally performed the services described in this documentation, which was scribed in my presence. The recorded information has been reviewed and considered for accuracy and completeness, addended by me as needed, and agree with information above.  Signed,   Meredith Staggers, MD [facility] at [facility].  01/05/18 10:35 AM

## 2018-01-06 LAB — LIPID PANEL
CHOL/HDL RATIO: 8.2 ratio — AB (ref 0.0–5.0)
Cholesterol, Total: 204 mg/dL — ABNORMAL HIGH (ref 100–199)
HDL: 25 mg/dL — AB (ref >39)
LDL Calculated: 141 mg/dL — ABNORMAL HIGH (ref 0–99)
Triglycerides: 192 mg/dL — ABNORMAL HIGH (ref 0–149)
VLDL Cholesterol Cal: 38 mg/dL (ref 5–40)

## 2018-01-06 LAB — COMPREHENSIVE METABOLIC PANEL
A/G RATIO: 1.4 (ref 1.2–2.2)
ALBUMIN: 4.4 g/dL (ref 3.5–5.5)
ALT: 35 IU/L (ref 0–44)
AST: 42 IU/L — ABNORMAL HIGH (ref 0–40)
Alkaline Phosphatase: 133 IU/L — ABNORMAL HIGH (ref 39–117)
BILIRUBIN TOTAL: 0.3 mg/dL (ref 0.0–1.2)
BUN/Creatinine Ratio: 9 (ref 9–20)
BUN: 7 mg/dL (ref 6–24)
CALCIUM: 9.3 mg/dL (ref 8.7–10.2)
CHLORIDE: 98 mmol/L (ref 96–106)
CO2: 24 mmol/L (ref 20–29)
Creatinine, Ser: 0.74 mg/dL — ABNORMAL LOW (ref 0.76–1.27)
GFR, EST AFRICAN AMERICAN: 130 mL/min/{1.73_m2} (ref >59)
GFR, EST NON AFRICAN AMERICAN: 112 mL/min/{1.73_m2} (ref >59)
GLOBULIN, TOTAL: 3.2 g/dL (ref 1.5–4.5)
Glucose: 113 mg/dL — ABNORMAL HIGH (ref 65–99)
POTASSIUM: 3.7 mmol/L (ref 3.5–5.2)
SODIUM: 138 mmol/L (ref 134–144)
TOTAL PROTEIN: 7.6 g/dL (ref 6.0–8.5)

## 2018-01-06 LAB — HEMOGLOBIN A1C
Est. average glucose Bld gHb Est-mCnc: 126 mg/dL
Hgb A1c MFr Bld: 6 % — ABNORMAL HIGH (ref 4.8–5.6)

## 2018-01-06 LAB — HIV ANTIBODY (ROUTINE TESTING W REFLEX): HIV Screen 4th Generation wRfx: NONREACTIVE

## 2018-01-06 LAB — MICROALBUMIN / CREATININE URINE RATIO
CREATININE, UR: 207.2 mg/dL
Microalb/Creat Ratio: 11.2 mg/g creat (ref 0.0–30.0)
Microalbumin, Urine: 23.2 ug/mL

## 2018-01-06 LAB — GC/CHLAMYDIA PROBE AMP
Chlamydia trachomatis, NAA: NEGATIVE
Neisseria gonorrhoeae by PCR: NEGATIVE

## 2018-01-06 LAB — RPR: RPR: NONREACTIVE

## 2018-02-11 ENCOUNTER — Ambulatory Visit: Payer: Self-pay | Admitting: Cardiology

## 2018-02-28 DIAGNOSIS — I10 Essential (primary) hypertension: Secondary | ICD-10-CM | POA: Insufficient documentation

## 2018-02-28 DIAGNOSIS — R011 Cardiac murmur, unspecified: Secondary | ICD-10-CM | POA: Insufficient documentation

## 2018-02-28 NOTE — Progress Notes (Deleted)
Cardiology Office Note:    Date:  02/28/2018   ID:  John Obrien, DOB 15-Aug-1975, MRN [id number]  PCP:  Patient, No Pcp Per  Cardiologist:  Norman Herrlich, MD   Referring MD: Shade Flood, MD  ASSESSMENT:    No diagnosis found. PLAN:    In order of problems listed above:  1. ***  Next appointment   Medication Adjustments/Labs and Tests Ordered: Current medicines are reviewed at length with the patient today.  Concerns regarding medicines are outlined above.  No orders of the defined types were placed in this encounter.  No orders of the defined types were placed in this encounter.    No chief complaint on file. ***  History of Present Illness:    John Obrien is a 42 y.o. male who is being seen today for the evaluation of *** at the request of Shade Flood, MD.   No past medical history on file.  *** The histories are not reviewed yet. Please review them in the "History" navigator section and refresh this SmartLink.  Current Medications: No outpatient medications have been marked as taking for the 03/01/18 encounter (Appointment) with Baldo Daub, MD.     Allergies:   Patient has no known allergies.   Social History   Socioeconomic History  . Marital status: Single    Spouse name: Not on file  . Number of children: Not on file  . Years of education: Not on file  . Highest education level: Not on file  Occupational History  . Not on file  Social Needs  . Financial resource strain: Not on file  . Food insecurity:    Worry: Not on file    Inability: Not on file  . Transportation needs:    Medical: Not on file    Non-medical: Not on file  Tobacco Use  . Smoking status: Never Smoker  . Smokeless tobacco: Never Used  Substance and Sexual Activity  . Alcohol use: Yes    Comment: sometimes   . Drug use: Not on file  . Sexual activity: Not on file  Lifestyle  . Physical activity:    Days per week: Not on file    Minutes per  session: Not on file  . Stress: Not on file  Relationships  . Social connections:    Talks on phone: Not on file    Gets together: Not on file    Attends religious service: Not on file    Active member of club or organization: Not on file    Attends meetings of clubs or organizations: Not on file    Relationship status: Not on file  Other Topics Concern  . Not on file  Social History Narrative  . Not on file     Family History: The patient's ***family history includes Diabetes in his mother and sister; Stroke in his father.  ROS:   ROS Please see the history of present illness.    *** All other systems reviewed and are negative.  EKGs/Labs/Other Studies Reviewed:    The following studies were reviewed today: ***  EKG:  EKG is *** ordered today.  The ekg ordered today demonstrates ***  Recent Labs: 01/05/2018: ALT 35; BUN 7; Creatinine, Ser 0.74; Potassium 3.7; Sodium 138  Recent Lipid Panel    Component Value Date/Time   CHOL 204 (H) 01/05/2018 1059   TRIG 192 (H) 01/05/2018 1059   HDL 25 (L) 01/05/2018 1059   CHOLHDL 8.2 (H) 01/05/2018  1059   LDLCALC 141 (H) 01/05/2018 1059    Physical Exam:    VS:  There were no vitals taken for this visit.    Wt Readings from Last 3 Encounters:  01/05/18 153 lb 6.4 oz (69.6 kg)  01/19/17 157 lb 3.2 oz (71.3 kg)     GEN: *** Well nourished, well developed in no acute distress HEENT: Normal NECK: No JVD; No carotid bruits LYMPHATICS: No lymphadenopathy CARDIAC: ***RRR, no murmurs, rubs, gallops RESPIRATORY:  Clear to auscultation without rales, wheezing or rhonchi  ABDOMEN: Soft, non-tender, non-distended MUSCULOSKELETAL:  No edema; No deformity  SKIN: Warm and dry NEUROLOGIC:  Alert and oriented x 3 PSYCHIATRIC:  Normal affect     Signed, Norman HerrlichBrian Tahira Olivarez, MD  02/28/2018 10:07 AM    [facility]

## 2018-03-01 ENCOUNTER — Ambulatory Visit: Payer: Self-pay | Admitting: Cardiology

## 2018-03-15 NOTE — Progress Notes (Signed)
Cardiology Office Note:    Date:  03/18/2018   ID:  John Obrien, DOB [birth year], MRN [id number]  PCP:  Shade FloodGreene, Jeffrey R, MD  Cardiologist:  Norman HerrlichBrian Afia Messenger, MD   Referring MD: Shade FloodGreene, Jeffrey R, MD   ASSESSMENT:    1. Murmur   2. Essential hypertension    PLAN:    In order of problems listed above:  1. In clinical time context suggest a bicuspid aortic valve echocardiogram ordered if it is bicuspid and need antibiotic prophylaxis depending on the severity follow-up in 2 years with repeat echocardiogram.  In particular times a bicuspid valve can rapidly progress. 2. Stable continue current treatment 3. Hyperlipidemia is being managed by his PCP not yet committed to drug therapy  Next appointment to be determined after echocardiogram   Medication Adjustments/Labs and Tests Ordered: Current medicines are reviewed at length with the patient today.  Concerns regarding medicines are outlined above.  No orders of the defined types were placed in this encounter.  No orders of the defined types were placed in this encounter.    Chief Complaint  Patient presents with  . Chest Pain  . Heart Murmur    History of Present Illness:    John Obrien is a 42 y.o. male who is being seen today for the evaluation of heart murmur on physical examination initially referred 1 year ago without being seen and subsequently referred at the time of wellness exam 01/05/2018 at the request of Shade FloodGreene, Jeffrey R, MD.  He has a history of hypertension dyslipidemia with a recent lipid profile with a total cholesterol of 204 quite low HDL at 25 elevated LDL 141.  Triglycerides are moderately elevated at 192.  An EKG 01/19/2017 showed sinus rhythm with precordial T wave inversion consider ischemia.  He has a history of a heart murmur first auscultated 4 years ago he has no history of congenital rheumatic heart disease his mother has a heart murmur and is never required intervention.  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  Is ablated a Estate agentforklift operator.  For further evaluation with an echo suggesting aortic stenosis or hypertrophic cardiomyopathy undergo echocardiogram.  His EKG in my office today shows sinus rhythm left axis deviation left atrial enlargement possible left ventricular hypertrophy.  He has had no chest pain that is anginal in nature shortness of breath or syncope.  He has had no recent fever or chills.   Past Medical History:  Diagnosis Date  . Hypertension     History reviewed. No pertinent surgical history.  Current Medications: Current Meds  Medication Sig  . amLODipine (NORVASC) 5 MG tablet Take 1 tablet (5 mg total) by mouth daily.     Allergies:   Patient has no known allergies.   Social History   Socioeconomic History  . Marital status: Single    Spouse name: Not on file  . Number of children: Not on file  . Years of education: Not on file  . Highest education level: Not on file  Occupational History  . Not on file  Social Needs  . Financial resource strain: Not on file  . Food insecurity:    Worry: Not on file    Inability: Not on file  . Transportation needs:    Medical: Not on file    Non-medical: Not on file  Tobacco Use  . Smoking status: Never Smoker  . Smokeless tobacco: Never Used  Substance and Sexual Activity  . Alcohol use: Yes    Comment: sometimes   . Drug use: Never  . Sexual activity: Not on file  Lifestyle  . Physical activity:    Days per week: Not on file    Minutes per session: Not on file  . Stress: Not on file  Relationships  . Social connections:    Talks on phone: Not on file    Gets together: Not on file    Attends religious service: Not on file    Active member of club or organization: Not on file    Attends meetings of clubs or organizations: Not on file     Relationship status: Not on file  Other Topics Concern  . Not on file  Social History Narrative  . Not on file     Family History: The patient's family history includes Diabetes in his mother and sister; Stroke in his father.  ROS:   Review of Systems  Constitution: Negative.  HENT: Negative.   Eyes: Negative.   Cardiovascular: Positive for chest pain and palpitations.  Respiratory: Negative.   Endocrine: Negative.   Hematologic/Lymphatic: Negative.   Skin: Negative.   Musculoskeletal: Negative.   Gastrointestinal: Negative.   Genitourinary: Negative.   Neurological: Negative.   Psychiatric/Behavioral: Negative.   Allergic/Immunologic: Negative.    Please see the history of present illness.     All other systems reviewed and are negative.  EKGs/Labs/Other Studies Reviewed:    The following studies were reviewed today:   EKG:  EKG is  ordered today.  The ekg ordered today demonstrates sinus rhythm left atrial enlargement left axis deviation probable LVH  Recent Labs: 01/05/2018: ALT 35; BUN 7; Creatinine, Ser 0.74; Potassium 3.7; Sodium 138  Recent Lipid Panel    Component Value Date/Time   CHOL 204 (H) 01/05/2018 1059   TRIG 192 (H) 01/05/2018 1059   HDL 25 (L) 01/05/2018 1059   CHOLHDL 8.2 (H) 01/05/2018 1059   LDLCALC 141 (H) 01/05/2018 1059    Physical Exam:    VS:  BP (!) 148/76   Pulse (!) 105   Ht 5\' 4"  (1.626 m)   Wt 156 lb 6.4 oz (70.9 kg)   SpO2 98%   BMI 26.85 kg/m     Wt Readings from Last 3 Encounters:  03/18/18 156 lb 6.4 oz (70.9 kg)  01/05/18 153 lb 6.4 oz (69.6 kg)  01/19/17 157 lb 3.2 oz (71.3 kg)     GEN:  Well nourished, well developed in no acute distress HEENT: Normal NECK: No JVD; No carotid bruits LYMPHATICS: No lymphadenopathy CARDIAC: 2/6 SEM mid peak S2 normal no AR radiated to right clavicle and not to carotids RRR, no, rubs, gallops RESPIRATORY:  Clear to auscultation without rales, wheezing or rhonchi  ABDOMEN: Soft,  non-tender, non-distended MUSCULOSKELETAL:  No edema; No deformity  SKIN: Warm and dry NEUROLOGIC:  Alert and oriented x 3 PSYCHIATRIC:  Normal affect     Signed, Norman HerrlichBrian Jalon Blackwelder, MD  03/18/2018 2:21 PM    [facility]

## 2018-03-18 ENCOUNTER — Encounter: Payer: Self-pay | Admitting: Cardiology

## 2018-03-18 ENCOUNTER — Ambulatory Visit (INDEPENDENT_AMBULATORY_CARE_PROVIDER_SITE_OTHER): Payer: BLUE CROSS/BLUE SHIELD | Admitting: Cardiology

## 2018-03-18 VITALS — BP 148/76 | HR 105 | Ht 64.0 in | Wt 156.4 lb

## 2018-03-18 DIAGNOSIS — R011 Cardiac murmur, unspecified: Secondary | ICD-10-CM | POA: Diagnosis not present

## 2018-03-18 DIAGNOSIS — I10 Essential (primary) hypertension: Secondary | ICD-10-CM | POA: Diagnosis not present

## 2018-03-18 NOTE — Patient Instructions (Signed)
Medication Instructions:  Your physician recommends that you continue on your current medications as directed. Please refer to the Current Medication list given to you today.  If you need a refill on your cardiac medications before your next appointment, please call your pharmacy.   Lab work: None  If you have labs (blood work) drawn today and your tests are completely normal, you will receive your results only by: . MyChart Message (if you have MyChart) OR . A paper copy in the mail If you have any lab test that is abnormal or we need to change your treatment, we will call you to review the results.  Testing/Procedures: You had an EKG today.   Your physician has requested that you have an echocardiogram. Echocardiography is a painless test that uses sound waves to create images of your heart. It provides your doctor with information about the size and shape of your heart and how well your heart's chambers and valves are working. This procedure takes approximately one hour. There are no restrictions for this procedure.    Follow-Up: At [facility], you and your health needs are our priority.  As part of our continuing mission to provide you with exceptional heart care, we have created designated Provider Care Teams.  These Care Teams include your primary Cardiologist (physician) and Advanced Practice Providers (APPs -  Physician Assistants and Nurse Practitioners) who all work together to provide you with the care you need, when you need it. You will need a follow up appointment as needed if symptoms worsen or fail to improve.       Echocardiogram An echocardiogram is a procedure that uses painless sound waves (ultrasound) to produce an image of the heart. Images from an echocardiogram can provide important information about:  Signs of coronary artery disease (CAD).  Aneurysm detection. An aneurysm is a weak or damaged part of an artery wall that bulges out from the normal force  of blood pumping through the body.  Heart size and shape. Changes in the size or shape of the heart can be associated with certain conditions, including heart failure, aneurysm, and CAD.  Heart muscle function.  Heart valve function.  Signs of a past heart attack.  Fluid buildup around the heart.  Thickening of the heart muscle.  A tumor or infectious growth around the heart valves. Tell a health care provider about:  Any allergies you have.  All medicines you are taking, including vitamins, herbs, eye drops, creams, and over-the-counter medicines.  Any blood disorders you have.  Any surgeries you have had.  Any medical conditions you have.  Whether you are pregnant or may be pregnant. What are the risks? Generally, this is a safe procedure. However, problems may occur, including:  Allergic reaction to dye (contrast) that may be used during the procedure. What happens before the procedure? No specific preparation is needed. You may eat and drink normally. What happens during the procedure?   An IV tube may be inserted into one of your veins.  You may receive contrast through this tube. A contrast is an injection that improves the quality of the pictures from your heart.  A gel will be applied to your chest.  A wand-like tool (transducer) will be moved over your chest. The gel will help to transmit the sound waves from the transducer.  The sound waves will harmlessly bounce off of your heart to allow the heart images to be captured in real-time motion. The images will be recorded on   a computer. The procedure may vary among health care providers and hospitals. What happens after the procedure?  You may return to your normal, everyday life, including diet, activities, and medicines, unless your health care provider tells you not to do that. Summary  An echocardiogram is a procedure that uses painless sound waves (ultrasound) to produce an image of the heart.  Images  from an echocardiogram can provide important information about the size and shape of your heart, heart muscle function, heart valve function, and fluid buildup around your heart.  You do not need to do anything to prepare before this procedure. You may eat and drink normally.  After the echocardiogram is completed, you may return to your normal, everyday life, unless your health care provider tells you not to do that. This information is not intended to replace advice given to you by your health care provider. Make sure you discuss any questions you have with your health care provider. Document Released: 03/07/2000 Document Revised: 04/12/2016 Document Reviewed: 04/12/2016 Elsevier Interactive Patient Education  2019 [employer]    

## 2018-03-23 ENCOUNTER — Ambulatory Visit (HOSPITAL_BASED_OUTPATIENT_CLINIC_OR_DEPARTMENT_OTHER): Admission: RE | Admit: 2018-03-23 | Payer: BLUE CROSS/BLUE SHIELD | Source: Ambulatory Visit

## 2018-04-21 IMAGING — DX DG CHEST 2V
2 series · 2 of 2 positions shown · non-contrast
Comparison: None.

CLINICAL DATA: 41 y/o  M; heart murmur, rule out cardiomegaly.

EXAM:
CHEST  2 VIEW

[chest pa]
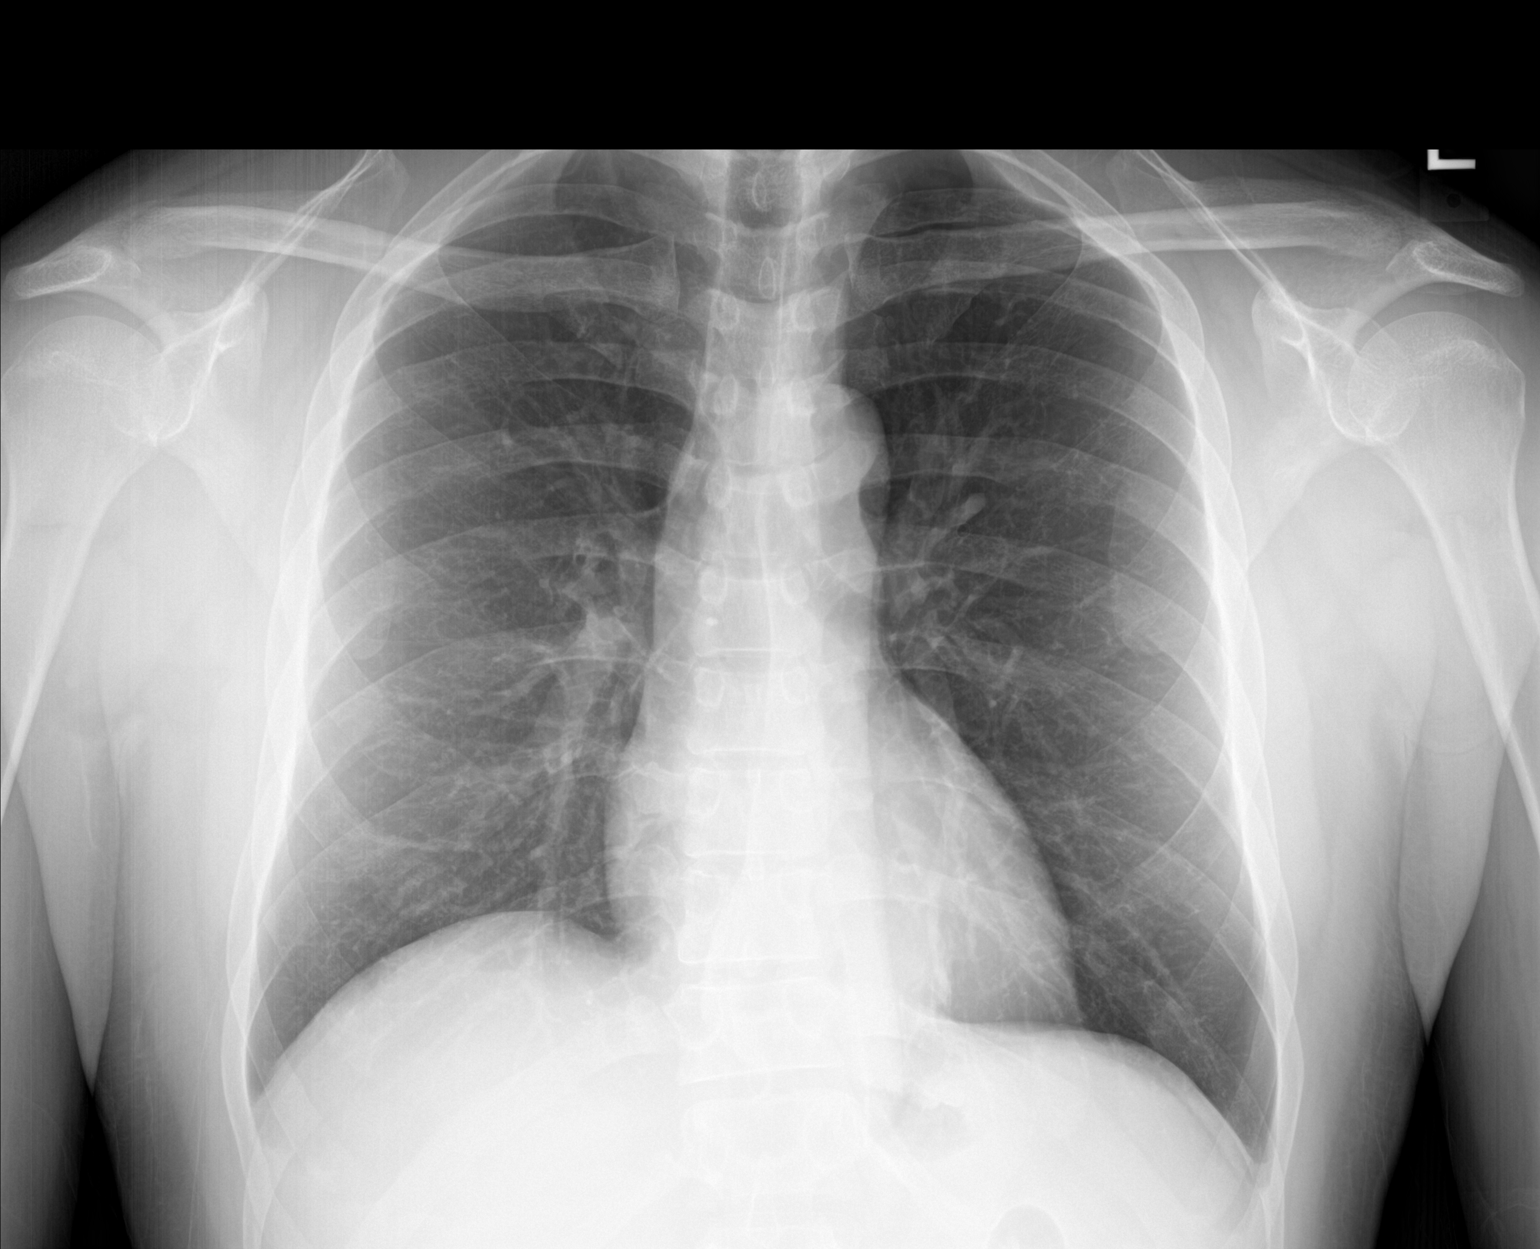

[chest lat]
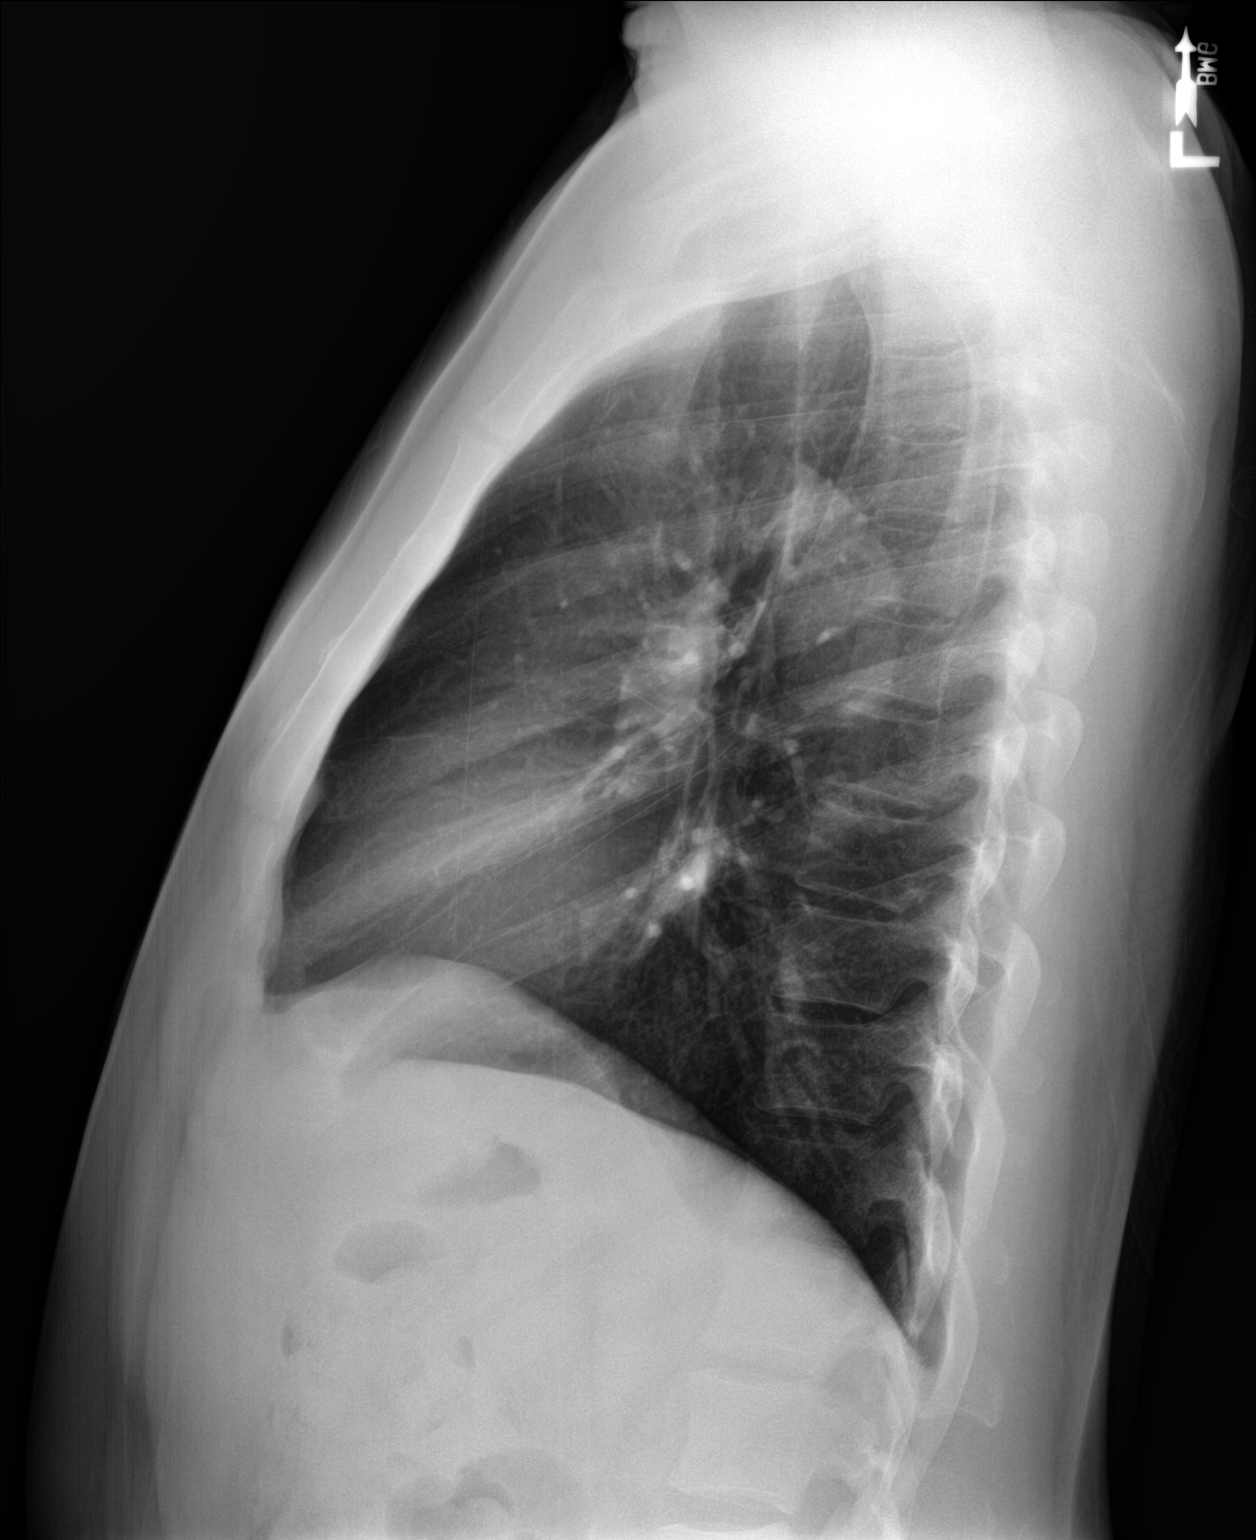

[2 of 2 positions shown; findings below may reference images not displayed]

FINDINGS: The heart size and mediastinal contours are within normal limits.
Both lungs are clear. The visualized skeletal structures are
unremarkable.
IMPRESSION: Normal chest radiograph.  No cardiomegaly.

By: Orvin Knox M.D.

## 2018-08-11 ENCOUNTER — Other Ambulatory Visit: Payer: Self-pay | Admitting: Family Medicine

## 2018-08-11 DIAGNOSIS — I10 Essential (primary) hypertension: Secondary | ICD-10-CM

## 2018-08-11 NOTE — Telephone Encounter (Signed)
Refill request for Amlodipine; last office visit 01/05/18; last refill 05/08/2018; no upcoming visits noted; attempted to contact pt; left message on voicemail; when pt calls back please schedule appointment; will grant 30 day courtesy refill to cover pt.  / Requested Prescriptions  Pending Prescriptions Disp Refills  . amLODipine (NORVASC) 5 MG tablet [Pharmacy Med Name: AMLODIPINE BESYLATE 5 MG TAB] 30 tablet 0    Sig: TAKE 1 TABLET BY MOUTH EVERY DAY     Cardiovascular:  Calcium Channel Blockers Failed - 08/11/2018  1:31 PM      Failed - Last BP in normal range    BP Readings from Last 1 Encounters:  03/18/18 (!) 148/76         Failed - Valid encounter within last 6 months    Recent Outpatient Visits          7 months ago Annual physical exam   [facility], Asencion Partridge, MD   1 year ago Hyperlipidemia, unspecified hyperlipidemia type   [facility], Asencion Partridge, MD

## 2018-08-26 ENCOUNTER — Other Ambulatory Visit: Payer: Self-pay | Admitting: Family Medicine

## 2018-08-26 DIAGNOSIS — I10 Essential (primary) hypertension: Secondary | ICD-10-CM

## 2018-08-27 ENCOUNTER — Other Ambulatory Visit: Payer: Self-pay | Admitting: Family Medicine

## 2018-08-27 DIAGNOSIS — I10 Essential (primary) hypertension: Secondary | ICD-10-CM

## 2018-12-23 ENCOUNTER — Other Ambulatory Visit: Payer: Self-pay | Admitting: Family Medicine

## 2018-12-23 DIAGNOSIS — I10 Essential (primary) hypertension: Secondary | ICD-10-CM

## 2018-12-23 NOTE — Telephone Encounter (Signed)
Requested medication (s) are due for refill today: yes  Requested medication (s) are on the active medication list: yes  Last refill:  11/24/2018  Future visit scheduled: no  Notes to clinic: review for refill   Requested Prescriptions  Pending Prescriptions Disp Refills   amLODipine (NORVASC) 5 MG tablet [Pharmacy Med Name: AMLODIPINE BESYLATE 5 MG TAB] 30 tablet 0    Sig: TAKE 1 TABLET BY MOUTH EVERY DAY     Cardiovascular:  Calcium Channel Blockers Failed - 12/23/2018  8:32 AM      Failed - Last BP in normal range    BP Readings from Last 1 Encounters:  03/18/18 (!) 148/76         Failed - Valid encounter within last 6 months    Recent Outpatient Visits          11 months ago Annual physical exam   [facility], Ranell Patrick, MD   1 year ago Hyperlipidemia, unspecified hyperlipidemia type   [facility], Ranell Patrick, MD

## 2019-02-05 ENCOUNTER — Other Ambulatory Visit: Payer: Self-pay | Admitting: Family Medicine

## 2019-02-05 DIAGNOSIS — I10 Essential (primary) hypertension: Secondary | ICD-10-CM

## 2019-02-06 NOTE — Telephone Encounter (Signed)
Requested medication (s) are due for refill today: yes  Requested medication (s) are on the active medication list: yes  Last refill:  11/24/2018  Future visit scheduled: no  Notes to clinic:  Overdue for office visit  Review for refill   Requested Prescriptions  Pending Prescriptions Disp Refills   amLODipine (NORVASC) 5 MG tablet [Pharmacy Med Name: AMLODIPINE BESYLATE 5 MG TAB] 30 tablet 0    Sig: TAKE 1 TABLET BY MOUTH EVERY DAY     Cardiovascular:  Calcium Channel Blockers Failed - 02/05/2019 12:53 PM      Failed - Last BP in normal range    BP Readings from Last 1 Encounters:  03/18/18 (!) 148/76         Failed - Valid encounter within last 6 months    Recent Outpatient Visits          1 year ago Annual physical exam   [facility], Ranell Patrick, MD   2 years ago Hyperlipidemia, unspecified hyperlipidemia type   [facility], Ranell Patrick, MD

## 2019-02-07 ENCOUNTER — Telehealth: Payer: Self-pay | Admitting: *Deleted

## 2019-02-07 NOTE — Telephone Encounter (Signed)
lvmtcb and notified pt of refill status °

## 2019-02-07 NOTE — Telephone Encounter (Signed)
Please schedule patient.  No medication has been sent.  When patient schedules enough medication can be sent in to get him to his appointment.

## 2019-02-10 ENCOUNTER — Encounter: Payer: Self-pay | Admitting: Family Medicine

## 2019-02-10 ENCOUNTER — Ambulatory Visit: Payer: BC Managed Care – PPO | Admitting: Family Medicine

## 2019-02-10 ENCOUNTER — Other Ambulatory Visit: Payer: Self-pay

## 2019-02-10 VITALS — BP 175/107 | HR 101 | Temp 98.1°F | Wt 153.6 lb

## 2019-02-10 DIAGNOSIS — R7989 Other specified abnormal findings of blood chemistry: Secondary | ICD-10-CM

## 2019-02-10 DIAGNOSIS — R7303 Prediabetes: Secondary | ICD-10-CM | POA: Diagnosis not present

## 2019-02-10 DIAGNOSIS — I10 Essential (primary) hypertension: Secondary | ICD-10-CM | POA: Diagnosis not present

## 2019-02-10 DIAGNOSIS — R011 Cardiac murmur, unspecified: Secondary | ICD-10-CM

## 2019-02-10 DIAGNOSIS — Z9114 Patient's other noncompliance with medication regimen: Secondary | ICD-10-CM

## 2019-02-10 DIAGNOSIS — E785 Hyperlipidemia, unspecified: Secondary | ICD-10-CM | POA: Diagnosis not present

## 2019-02-10 LAB — POCT GLYCOSYLATED HEMOGLOBIN (HGB A1C): Hemoglobin A1C: 6.3 % — AB (ref 4.0–5.6)

## 2019-02-10 LAB — GLUCOSE, POCT (MANUAL RESULT ENTRY): POC Glucose: 129 mg/dL — AB (ref 70–99)

## 2019-02-10 MED ORDER — AMLODIPINE BESYLATE 5 MG PO TABS: 5.0000 mg | ORAL_TABLET | Freq: Every day | ORAL | 1 refills | Status: DC

## 2019-02-10 NOTE — Progress Notes (Signed)
Subjective:  Patient ID: John Obrien, male    DOB: 1975-04-09  Age: 43 y.o. MRN: [id number]  CC:  Chief Complaint  Patient presents with   Medical Management of Chronic Issues    Need refill on meds. Patient bp was high in office today. Patient stated he was out of bp med for 1-2 weeks now and this may be the reason his bp is elevated    HPI John Obrien presents for   Hypertension: Last evaluated by cardiology, Dr. Dulce Sellar in December 2019 for heart murmur.  Echocardiogram was recommended.  Possible bicuspid aortic valve.  It appears the echocardiogram was canceled.  He last saw me in October 2019.  Started amlodipine 5 mg at that time with plan on recheck in 1 month for med adjustment.  Unfortunately has not seen me since that visit. Off meds for few weeks (but chart indicates only 30 refilled in May). No side effects on meds.   Home readings: BP Readings from Last 3 Encounters:  02/10/19 (!) 175/107  03/18/18 (!) 148/76  01/05/18 (!) 162/92   Lab Results  Component Value Date   CREATININE 0.74 (L) 01/05/2018   Hyperlipidemia: Elevated in October 2019.  Similar range to 1 year prior.  Recommended follow-up to discuss treatments.  Lab Results  Component Value Date   CHOL 204 (H) 01/05/2018   HDL 25 (L) 01/05/2018   LDLCALC 141 (H) 01/05/2018   TRIG 192 (H) 01/05/2018   CHOLHDL 8.2 (H) 01/05/2018   Lab Results  Component Value Date   ALT 35 01/05/2018   AST 42 (H) 01/05/2018   ALKPHOS 133 (H) 01/05/2018   BILITOT 0.3 01/05/2018  The 10-year ASCVD risk score Denman George DC Jr., et al., 2013) is: 14.8%   Values used to calculate the score:     Age: 65 years     Sex: Male     Is Non-Hispanic African American: Yes     Diabetic: No     Tobacco smoker: No     Systolic Blood Pressure: 175 mmHg     Is BP treated: Yes     HDL Cholesterol: 25 mg/dL     Total Cholesterol: 204 mg/dL    Prediabetes Some increased thirst past few months. Occasional blurry vision, but  episodic. Has not discussed wth optho.  No n/v, abd pain, wt loss.  Lab Results  Component Value Date   HGBA1C 6.0 (H) 01/05/2018   Wt Readings from Last 3 Encounters:  02/10/19 153 lb 9.6 oz (69.7 kg)  03/18/18 156 lb 6.4 oz (70.9 kg)  01/05/18 153 lb 6.4 oz (69.6 kg)    Elevated LFTs: Lab Results  Component Value Date   ALT 35 01/05/2018   AST 42 (H) 01/05/2018   ALKPHOS 133 (H) 01/05/2018   BILITOT 0.3 01/05/2018  Elevated at last visit October 2019, recommended 4 to 6-week recheck.  Alcohol: up to 2 mixed drinks per day, avg 14 /week.    Office Visit from 02/10/2019 in [facility] at [facility]  AUDIT-C Score  5      Also has appt on 11/25 for CPE  History Patient Active Problem List   Diagnosis Date Noted   Murmur 02/28/2018   Essential hypertension 02/28/2018   Past Medical History:  Diagnosis Date   Hypertension    No past surgical history on file. No Known Allergies Prior to Admission medications   Medication Sig Start Date End Date Taking? Authorizing Provider  amLODipine (NORVASC) 5 MG  tablet TAKE 1 TABLET BY MOUTH EVERY DAY 08/11/18  Yes Shade Flood, MD   Social History   Socioeconomic History   Marital status: Single    Spouse name: Not on file   Number of children: Not on file   Years of education: Not on file   Highest education level: Not on file  Occupational History   Not on file  Social Needs   Financial resource strain: Not on file   Food insecurity    Worry: Not on file    Inability: Not on file   Transportation needs    Medical: Not on file    Non-medical: Not on file  Tobacco Use   Smoking status: Never Smoker   Smokeless tobacco: Never Used  Substance and Sexual Activity   Alcohol use: Yes    Comment: sometimes    Drug use: Never   Sexual activity: Not on file  Lifestyle   Physical activity    Days per week: Not on file    Minutes per session: Not on file   Stress: Not on file  Relationships    Social connections    Talks on phone: Not on file    Gets together: Not on file    Attends religious service: Not on file    Active member of club or organization: Not on file    Attends meetings of clubs or organizations: Not on file    Relationship status: Not on file   Intimate partner violence    Fear of current or ex partner: Not on file    Emotionally abused: Not on file    Physically abused: Not on file    Forced sexual activity: Not on file  Other Topics Concern   Not on file  Social History Narrative   Not on file    Review of Systems  Constitutional: Negative for fatigue and unexpected weight change.  Eyes: Positive for visual disturbance.  Respiratory: Negative for cough, chest tightness and shortness of breath.   Cardiovascular: Negative for chest pain, palpitations and leg swelling.  Gastrointestinal: Negative for abdominal pain, blood in stool, nausea and vomiting.  Endocrine: Positive for polydipsia.  Neurological: Negative for dizziness, light-headedness and headaches.    Objective:   Vitals:   02/10/19 1355  BP: (!) 175/107  Pulse: (!) 101  Temp: 98.1 F (36.7 C)  TempSrc: Oral  SpO2: 100%  Weight: 153 lb 9.6 oz (69.7 kg)     Physical Exam Vitals signs reviewed.  Constitutional:      Appearance: He is well-developed.  HENT:     Head: Normocephalic and atraumatic.  Eyes:     Pupils: Pupils are equal, round, and reactive to light.  Neck:     Vascular: No carotid bruit or JVD.  Cardiovascular:     Rate and Rhythm: Normal rate and regular rhythm.     Heart sounds: Murmur (2-3/6 RUSB. ) present.  Pulmonary:     Effort: Pulmonary effort is normal.     Breath sounds: Normal breath sounds. No rales.  Skin:    General: Skin is warm and dry.  Neurological:     Mental Status: He is alert and oriented to person, place, and time.    Results for orders placed or performed in visit on 02/10/19  POCT glucose (manual entry)  Result Value Ref Range     POC Glucose 129 (A) 70 - 99 mg/dl  POCT glycosylated hemoglobin (Hb A1C)  Result Value Ref Range  Hemoglobin A1C 6.3 (A) 4.0 - 5.6 %   HbA1c POC (<> result, manual entry)     HbA1c, POC (prediabetic range)     HbA1c, POC (controlled diabetic range)       Assessment & Plan:  John Obrien is a 43 y.o. male . Heart murmur - Plan: Ambulatory referral to Cardiology  -Cardiology follow-up arranged next week.  Likely will need echo as planned last year.  Essential hypertension - Plan: amLODipine (NORVASC) 5 MG tablet, Ambulatory referral to Cardiology  -Decreased control, but previous med nonadherence.  Concerned he may have been off medication more than just prior 2 weeks based on prior refill, but will restart amlodipine initially at 5 mg daily.  Recheck next week as planned.  Prediabetes - Plan: POCT glucose (manual entry), POCT glycosylated hemoglobin (Hb A1C)  -Diet, low intensity exercise, handout given.  Plan to recheck next 6 months.  -Doubt intermittent blurring of vision due to hyperglycemia.  Recommended he contact ophthalmologist/optometrist to discuss further.  Elevated LFTs - Plan: Comprehensive metabolic panel  -Advised on decreasing alcohol use.  Repeat testing.  Discuss further at follow-up next week  Hyperlipidemia, unspecified hyperlipidemia type - Plan: Comprehensive metabolic panel, Lipid panel  -Check labs consider statin based on ASCVD risk score.  Elevated score today in part due to elevated blood pressure.  Nonadherence to medication  -Discussed any potential barriers to care, none disclosed.  No orders of the defined types were placed in this encounter.  Patient Instructions     Call your eye doctor for appointment to discuss the blurry vision at times.  I will refer you back to the cardiologist to evaluate the heart murmur and likely echocardiogram that was scheduled last year. Decrease alcohol to no more than 1 drink per day, and avoidance  altogether is ideal with previous elevated liver tests. Blood pressure elevated today off medicines but likely will need stronger medication.  Start back at 5 mg amlodipine once per day for now and follow-up next week as planned. Return to the clinic or go to the nearest emergency room if any of your symptoms worsen or new symptoms occur.   Prediabetes Prediabetes is the condition of having a blood sugar (blood glucose) level that is higher than it should be, but not high enough for you to be diagnosed with type 2 diabetes. Having prediabetes puts you at risk for developing type 2 diabetes (type 2 diabetes mellitus). Prediabetes may be called impaired glucose tolerance or impaired fasting glucose. Prediabetes usually does not cause symptoms. Your health care provider can diagnose this condition with blood tests. You may be tested for prediabetes if you are overweight and if you have at least one other risk factor for prediabetes. What is blood glucose, and how is it measured? Blood glucose refers to the amount of glucose in your bloodstream. Glucose comes from eating foods that contain sugars and starches (carbohydrates), which the body breaks down into glucose. Your blood glucose level may be measured in mg/dL (milligrams per deciliter) or mmol/L (millimoles per liter). Your blood glucose may be checked with one or more of the following blood tests:  A fasting blood glucose (FBG) test. You will not be allowed to eat (you will fast) for 8 hours or longer before a blood sample is taken. ? A normal range for FBG is 70-100 mg/dl (8.2-9.5 mmol/L).  An A1c (hemoglobin A1c) blood test. This test provides information about blood glucose control over the previous 2?3months.  An oral  glucose tolerance test (OGTT). This test measures your blood glucose at two times: ? After fasting. This is your baseline level. ? Two hours after you drink a beverage that contains glucose. You may be diagnosed with  prediabetes:  If your FBG is 100?125 mg/dL (1.6-1.0 mmol/L).  If your A1c level is 5.7?6.4%.  If your OGTT result is 140?199 mg/dL (9.6-04 mmol/L). These blood tests may be repeated to confirm your diagnosis. How can this condition affect me? The pancreas produces a hormone (insulin) that helps to move glucose from the bloodstream into cells. When cells in the body do not respond properly to insulin that the body makes (insulin resistance), excess glucose builds up in the blood instead of going into cells. As a result, high blood glucose (hyperglycemia) can develop, which can cause many complications. Hyperglycemia is a symptom of prediabetes. Having high blood glucose for a long time is dangerous. Too much glucose in your blood can damage your nerves and blood vessels. Long-term damage can lead to complications from diabetes, which may include:  Heart disease.  Stroke.  Blindness.  Kidney disease.  Depression.  Poor circulation in the feet and legs, which could lead to surgical removal (amputation) in severe cases. What can increase my risk? Risk factors for prediabetes include:  Having a family member with type 2 diabetes.  Being overweight or obese.  Being older than age 18.  Being of American [place], African-American, Hispanic/Latino, or Asian/Pacific Islander descent.  Having an inactive (sedentary) lifestyle.  Having a history of heart disease.  History of gestational diabetes or polycystic ovary syndrome (PCOS), in women.  Having low levels of good cholesterol (HDL-C) or high levels of blood fats (triglycerides).  Having high blood pressure. What actions can I take to prevent diabetes?      Be physically active. ? Do moderate-intensity physical activity for 30 or more minutes on 5 or more days of the week, or as much as told by your health care provider. This could be brisk walking, biking, or water aerobics. ? Ask your health care provider what activities are  safe for you. A mix of physical activities may be best, such as walking, swimming, cycling, and strength training.  Lose weight as told by your health care provider. ? Losing 5-7% of your body weight can reverse insulin resistance. ? Your health care provider can determine how much weight loss is best for you and can help you lose weight safely.  Follow a healthy meal plan. This includes eating lean proteins, complex carbohydrates, fresh fruits and vegetables, low-fat dairy products, and healthy fats. ? Follow instructions from your health care provider about eating or drinking restrictions. ? Make an appointment to see a diet and nutrition specialist (registered dietitian) to help you create a healthy eating plan that is right for you.  Do not smoke or use any tobacco products, such as cigarettes, chewing tobacco, and e-cigarettes. If you need help quitting, ask your health care provider.  Take over-the-counter and prescription medicines as told by your health care provider. You may be prescribed medicines that help lower the risk of type 2 diabetes.  Keep all follow-up visits as told by your health care provider. This is important. Summary  Prediabetes is the condition of having a blood sugar (blood glucose) level that is higher than it should be, but not high enough for you to be diagnosed with type 2 diabetes.  Having prediabetes puts you at risk for developing type 2 diabetes (type 2 diabetes  mellitus).  To help prevent type 2 diabetes, make lifestyle changes such as being physically active and eating a healthy diet. Lose weight as told by your health care provider. This information is not intended to replace advice given to you by your health care provider. Make sure you discuss any questions you have with your health care provider. Document Released: 07/02/2015 Document Revised: 07/02/2018 Document Reviewed: 05/01/2015 Elsevier Patient Education  2020 [employer]    Managing  Your Hypertension Hypertension is commonly called high blood pressure. This is when the force of your blood pressing against the walls of your arteries is too strong. Arteries are blood vessels that carry blood from your heart throughout your body. Hypertension forces the heart to work harder to pump blood, and may cause the arteries to become narrow or stiff. Having untreated or uncontrolled hypertension can cause heart attack, stroke, kidney disease, and other problems. What are blood pressure readings? A blood pressure reading consists of a higher number over a lower number. Ideally, your blood pressure should be below 120/80. The first ("top") number is called the systolic pressure. It is a measure of the pressure in your arteries as your heart beats. The second ("bottom") number is called the diastolic pressure. It is a measure of the pressure in your arteries as the heart relaxes. What does my blood pressure reading mean? Blood pressure is classified into four stages. Based on your blood pressure reading, your health care provider may use the following stages to determine what type of treatment you need, if any. Systolic pressure and diastolic pressure are measured in a unit called mm Hg. Normal  Systolic pressure: below 120.  Diastolic pressure: below 80. Elevated  Systolic pressure: 120-129.  Diastolic pressure: below 80. Hypertension stage 1  Systolic pressure: 130-139.  Diastolic pressure: 80-89. Hypertension stage 2  Systolic pressure: 140 or above.  Diastolic pressure: 90 or above. What health risks are associated with hypertension? Managing your hypertension is an important responsibility. Uncontrolled hypertension can lead to:  A heart attack.  A stroke.  A weakened blood vessel (aneurysm).  Heart failure.  Kidney damage.  Eye damage.  Metabolic syndrome.  Memory and concentration problems. What changes can I make to manage my hypertension? Hypertension can  be managed by making lifestyle changes and possibly by taking medicines. Your health care provider will help you make a plan to bring your blood pressure within a normal range. Eating and drinking   Eat a diet that is high in fiber and potassium, and low in salt (sodium), added sugar, and fat. An example eating plan is called the DASH (Dietary Approaches to Stop Hypertension) diet. To eat this way: ? Eat plenty of fresh fruits and vegetables. Try to fill half of your plate at each meal with fruits and vegetables. ? Eat whole grains, such as whole wheat pasta, brown rice, or whole grain bread. Fill about one quarter of your plate with whole grains. ? Eat low-fat diary products. ? Avoid fatty cuts of meat, processed or cured meats, and poultry with skin. Fill about one quarter of your plate with lean proteins such as fish, chicken without skin, beans, eggs, and tofu. ? Avoid premade and processed foods. These tend to be higher in sodium, added sugar, and fat.  Reduce your daily sodium intake. Most people with hypertension should eat less than 1,500 mg of sodium a day.  Limit alcohol intake to no more than 1 drink a day for nonpregnant women and 2 drinks  a day for men. One drink equals 12 oz of beer, 5 oz of wine, or 1 oz of hard liquor. Lifestyle  Work with your health care provider to maintain a healthy body weight, or to lose weight. Ask what an ideal weight is for you.  Get at least 30 minutes of exercise that causes your heart to beat faster (aerobic exercise) most days of the week. Activities may include walking, swimming, or biking.  Include exercise to strengthen your muscles (resistance exercise), such as weight lifting, as part of your weekly exercise routine. Try to do these types of exercises for 30 minutes at least 3 days a week.  Do not use any products that contain nicotine or tobacco, such as cigarettes and e-cigarettes. If you need help quitting, ask your health care  provider.  Control any long-term (chronic) conditions you have, such as high cholesterol or diabetes. Monitoring  Monitor your blood pressure at home as told by your health care provider. Your personal target blood pressure may vary depending on your medical conditions, your age, and other factors.  Have your blood pressure checked regularly, as often as told by your health care provider. Working with your health care provider  Review all the medicines you take with your health care provider because there may be side effects or interactions.  Talk with your health care provider about your diet, exercise habits, and other lifestyle factors that may be contributing to hypertension.  Visit your health care provider regularly. Your health care provider can help you create and adjust your plan for managing hypertension. Will I need medicine to control my blood pressure? Your health care provider may prescribe medicine if lifestyle changes are not enough to get your blood pressure under control, and if:  Your systolic blood pressure is 130 or higher.  Your diastolic blood pressure is 80 or higher. Take medicines only as told by your health care provider. Follow the directions carefully. Blood pressure medicines must be taken as prescribed. The medicine does not work as well when you skip doses. Skipping doses also puts you at risk for problems. Contact a health care provider if:  You think you are having a reaction to medicines you have taken.  You have repeated (recurrent) headaches.  You feel dizzy.  You have swelling in your ankles.  You have trouble with your vision. Get help right away if:  You develop a severe headache or confusion.  You have unusual weakness or numbness, or you feel faint.  You have severe pain in your chest or abdomen.  You vomit repeatedly.  You have trouble breathing. Summary  Hypertension is when the force of blood pumping through your arteries is  too strong. If this condition is not controlled, it may put you at risk for serious complications.  Your personal target blood pressure may vary depending on your medical conditions, your age, and other factors. For most people, a normal blood pressure is less than 120/80.  Hypertension is managed by lifestyle changes, medicines, or both. Lifestyle changes include weight loss, eating a healthy, low-sodium diet, exercising more, and limiting alcohol. This information is not intended to replace advice given to you by your health care provider. Make sure you discuss any questions you have with your health care provider. Document Released: 12/03/2011 Document Revised: 07/02/2018 Document Reviewed: 02/06/2016 Elsevier Patient Education  [employer].   If you have lab work done today you will be contacted with your lab results within the next 2  weeks.  If you have not heard from [place] then please contact us. The fastest way to get your results is to register for My Chart.   IF you received an x-ray today, you will receive an invoice from [facility]. Please contact [facility] at [phone] with questions or concerns regarding your invoice.   IF you received labwork today, you will receive an invoice from [place]. Please contact [facility] at [phone] with questions or concerns regarding your invoice.   Our billing staff will not be able to assist you with questions regarding bills from these companies.  You will be contacted with the lab results as soon as they are available. The fastest way to get your results is to activate your My Chart account. Instructions are located on the last page of this paperwork. If you have not heard from [place] regarding the results in 2 weeks, please contact this office.          Signed, Meredith Staggers, MD [facility]

## 2019-02-10 NOTE — Patient Instructions (Addendum)
Call your eye doctor for appointment to discuss the blurry vision at times.  I will refer you back to the cardiologist to evaluate the heart murmur and likely echocardiogram that was scheduled last year. Decrease alcohol to no more than 1 drink per day, and avoidance altogether is ideal with previous elevated liver tests. Blood pressure elevated today off medicines but likely will need stronger medication.  Start back at 5 mg amlodipine once per day for now and follow-up next week as planned. Return to the clinic or go to the nearest emergency room if any of your symptoms worsen or new symptoms occur.   Prediabetes Prediabetes is the condition of having a blood sugar (blood glucose) level that is higher than it should be, but not high enough for you to be diagnosed with type 2 diabetes. Having prediabetes puts you at risk for developing type 2 diabetes (type 2 diabetes mellitus). Prediabetes may be called impaired glucose tolerance or impaired fasting glucose. Prediabetes usually does not cause symptoms. Your health care provider can diagnose this condition with blood tests. You may be tested for prediabetes if you are overweight and if you have at least one other risk factor for prediabetes. What is blood glucose, and how is it measured? Blood glucose refers to the amount of glucose in your bloodstream. Glucose comes from eating foods that contain sugars and starches (carbohydrates), which the body breaks down into glucose. Your blood glucose level may be measured in mg/dL (milligrams per deciliter) or mmol/L (millimoles per liter). Your blood glucose may be checked with one or more of the following blood tests:  A fasting blood glucose (FBG) test. You will not be allowed to eat (you will fast) for 8 hours or longer before a blood sample is taken. ? A normal range for FBG is 70-100 mg/dl (1.6-1.03.9-5.6 mmol/L).  An A1c (hemoglobin A1c) blood test. This test provides information about blood glucose  control over the previous 2?3months.  An oral glucose tolerance test (OGTT). This test measures your blood glucose at two times: ? After fasting. This is your baseline level. ? Two hours after you drink a beverage that contains glucose. You may be diagnosed with prediabetes:  If your FBG is 100?125 mg/dL (9.6-0.45.6-6.9 mmol/L).  If your A1c level is 5.7?6.4%.  If your OGTT result is 140?199 mg/dL (5.4-097.8-11 mmol/L). These blood tests may be repeated to confirm your diagnosis. How can this condition affect me? The pancreas produces a hormone (insulin) that helps to move glucose from the bloodstream into cells. When cells in the body do not respond properly to insulin that the body makes (insulin resistance), excess glucose builds up in the blood instead of going into cells. As a result, high blood glucose (hyperglycemia) can develop, which can cause many complications. Hyperglycemia is a symptom of prediabetes. Having high blood glucose for a long time is dangerous. Too much glucose in your blood can damage your nerves and blood vessels. Long-term damage can lead to complications from diabetes, which may include:  Heart disease.  Stroke.  Blindness.  Kidney disease.  Depression.  Poor circulation in the feet and legs, which could lead to surgical removal (amputation) in severe cases. What can increase my risk? Risk factors for prediabetes include:  Having a family member with type 2 diabetes.  Being overweight or obese.  Being older than age 45.  Being of American [place], African-American, Hispanic/Latino, or Asian/Pacific Islander descent.  Having an inactive (sedentary) lifestyle.  Having a history of  heart disease.  History of gestational diabetes or polycystic ovary syndrome (PCOS), in women.  Having low levels of good cholesterol (HDL-C) or high levels of blood fats (triglycerides).  Having high blood pressure. What actions can I take to prevent diabetes?      Be  physically active. ? Do moderate-intensity physical activity for 30 or more minutes on 5 or more days of the week, or as much as told by your health care provider. This could be brisk walking, biking, or water aerobics. ? Ask your health care provider what activities are safe for you. A mix of physical activities may be best, such as walking, swimming, cycling, and strength training.  Lose weight as told by your health care provider. ? Losing 5-7% of your body weight can reverse insulin resistance. ? Your health care provider can determine how much weight loss is best for you and can help you lose weight safely.  Follow a healthy meal plan. This includes eating lean proteins, complex carbohydrates, fresh fruits and vegetables, low-fat dairy products, and healthy fats. ? Follow instructions from your health care provider about eating or drinking restrictions. ? Make an appointment to see a diet and nutrition specialist (registered dietitian) to help you create a healthy eating plan that is right for you.  Do not smoke or use any tobacco products, such as cigarettes, chewing tobacco, and e-cigarettes. If you need help quitting, ask your health care provider.  Take over-the-counter and prescription medicines as told by your health care provider. You may be prescribed medicines that help lower the risk of type 2 diabetes.  Keep all follow-up visits as told by your health care provider. This is important. Summary  Prediabetes is the condition of having a blood sugar (blood glucose) level that is higher than it should be, but not high enough for you to be diagnosed with type 2 diabetes.  Having prediabetes puts you at risk for developing type 2 diabetes (type 2 diabetes mellitus).  To help prevent type 2 diabetes, make lifestyle changes such as being physically active and eating a healthy diet. Lose weight as told by your health care provider. This information is not intended to replace advice  given to you by your health care provider. Make sure you discuss any questions you have with your health care provider. Document Released: 07/02/2015 Document Revised: 07/02/2018 Document Reviewed: 05/01/2015 Elsevier Patient Education  2020 [employer].    Managing Your Hypertension Hypertension is commonly called high blood pressure. This is when the force of your blood pressing against the walls of your arteries is too strong. Arteries are blood vessels that carry blood from your heart throughout your body. Hypertension forces the heart to work harder to pump blood, and may cause the arteries to become narrow or stiff. Having untreated or uncontrolled hypertension can cause heart attack, stroke, kidney disease, and other problems. What are blood pressure readings? A blood pressure reading consists of a higher number over a lower number. Ideally, your blood pressure should be below 120/80. The first ("top") number is called the systolic pressure. It is a measure of the pressure in your arteries as your heart beats. The second ("bottom") number is called the diastolic pressure. It is a measure of the pressure in your arteries as the heart relaxes. What does my blood pressure reading mean? Blood pressure is classified into four stages. Based on your blood pressure reading, your health care provider may use the following stages to determine what type of treatment  you need, if any. Systolic pressure and diastolic pressure are measured in a unit called mm Hg. Normal  Systolic pressure: below 423.  Diastolic pressure: below 80. Elevated  Systolic pressure: 536-144.  Diastolic pressure: below 80. Hypertension stage 1  Systolic pressure: 315-400.  Diastolic pressure: 86-76. Hypertension stage 2  Systolic pressure: 195 or above.  Diastolic pressure: 90 or above. What health risks are associated with hypertension? Managing your hypertension is an important responsibility. Uncontrolled  hypertension can lead to:  A heart attack.  A stroke.  A weakened blood vessel (aneurysm).  Heart failure.  Kidney damage.  Eye damage.  Metabolic syndrome.  Memory and concentration problems. What changes can I make to manage my hypertension? Hypertension can be managed by making lifestyle changes and possibly by taking medicines. Your health care provider will help you make a plan to bring your blood pressure within a normal range. Eating and drinking   Eat a diet that is high in fiber and potassium, and low in salt (sodium), added sugar, and fat. An example eating plan is called the DASH (Dietary Approaches to Stop Hypertension) diet. To eat this way: ? Eat plenty of fresh fruits and vegetables. Try to fill half of your plate at each meal with fruits and vegetables. ? Eat whole grains, such as whole wheat pasta, brown rice, or whole grain bread. Fill about one quarter of your plate with whole grains. ? Eat low-fat diary products. ? Avoid fatty cuts of meat, processed or cured meats, and poultry with skin. Fill about one quarter of your plate with lean proteins such as fish, chicken without skin, beans, eggs, and tofu. ? Avoid premade and processed foods. These tend to be higher in sodium, added sugar, and fat.  Reduce your daily sodium intake. Most people with hypertension should eat less than 1,500 mg of sodium a day.  Limit alcohol intake to no more than 1 drink a day for nonpregnant women and 2 drinks a day for men. One drink equals 12 oz of beer, 5 oz of wine, or 1 oz of hard liquor. Lifestyle  Work with your health care provider to maintain a healthy body weight, or to lose weight. Ask what an ideal weight is for you.  Get at least 30 minutes of exercise that causes your heart to beat faster (aerobic exercise) most days of the week. Activities may include walking, swimming, or biking.  Include exercise to strengthen your muscles (resistance exercise), such as weight  lifting, as part of your weekly exercise routine. Try to do these types of exercises for 30 minutes at least 3 days a week.  Do not use any products that contain nicotine or tobacco, such as cigarettes and e-cigarettes. If you need help quitting, ask your health care provider.  Control any long-term (chronic) conditions you have, such as high cholesterol or diabetes. Monitoring  Monitor your blood pressure at home as told by your health care provider. Your personal target blood pressure may vary depending on your medical conditions, your age, and other factors.  Have your blood pressure checked regularly, as often as told by your health care provider. Working with your health care provider  Review all the medicines you take with your health care provider because there may be side effects or interactions.  Talk with your health care provider about your diet, exercise habits, and other lifestyle factors that may be contributing to hypertension.  Visit your health care provider regularly. Your health care provider can help  you create and adjust your plan for managing hypertension. Will I need medicine to control my blood pressure? Your health care provider may prescribe medicine if lifestyle changes are not enough to get your blood pressure under control, and if:  Your systolic blood pressure is 130 or higher.  Your diastolic blood pressure is 80 or higher. Take medicines only as told by your health care provider. Follow the directions carefully. Blood pressure medicines must be taken as prescribed. The medicine does not work as well when you skip doses. Skipping doses also puts you at risk for problems. Contact a health care provider if:  You think you are having a reaction to medicines you have taken.  You have repeated (recurrent) headaches.  You feel dizzy.  You have swelling in your ankles.  You have trouble with your vision. Get help right away if:  You develop a severe  headache or confusion.  You have unusual weakness or numbness, or you feel faint.  You have severe pain in your chest or abdomen.  You vomit repeatedly.  You have trouble breathing. Summary  Hypertension is when the force of blood pumping through your arteries is too strong. If this condition is not controlled, it may put you at risk for serious complications.  Your personal target blood pressure may vary depending on your medical conditions, your age, and other factors. For most people, a normal blood pressure is less than 120/80.  Hypertension is managed by lifestyle changes, medicines, or both. Lifestyle changes include weight loss, eating a healthy, low-sodium diet, exercising more, and limiting alcohol. This information is not intended to replace advice given to you by your health care provider. Make sure you discuss any questions you have with your health care provider. Document Released: 12/03/2011 Document Revised: 07/02/2018 Document Reviewed: 02/06/2016 Elsevier Patient Education  [employer].   If you have lab work done today you will be contacted with your lab results within the next 2 weeks.  If you have not heard from [place] then please contact us. The fastest way to get your results is to register for My Chart.   IF you received an x-ray today, you will receive an invoice from [facility]. Please contact [facility] at [phone] with questions or concerns regarding your invoice.   IF you received labwork today, you will receive an invoice from [facility]. Please contact [facility] at [phone] with questions or concerns regarding your invoice.   Our billing staff will not be able to assist you with questions regarding bills from these companies.  You will be contacted with the lab results as soon as they are available. The fastest way to get your results is to activate your My Chart account. Instructions are located on the last page of this  paperwork. If you have not heard from [place] regarding the results in 2 weeks, please contact this office.

## 2019-02-11 LAB — LIPID PANEL
Chol/HDL Ratio: 7.4 ratio — ABNORMAL HIGH (ref 0.0–5.0)
Cholesterol, Total: 260 mg/dL — ABNORMAL HIGH (ref 100–199)
HDL: 35 mg/dL — ABNORMAL LOW (ref >39)
LDL Chol Calc (NIH): 141 mg/dL — ABNORMAL HIGH (ref 0–99)
Triglycerides: 452 mg/dL — ABNORMAL HIGH (ref 0–149)
VLDL Cholesterol Cal: 84 mg/dL — ABNORMAL HIGH (ref 5–40)

## 2019-02-11 LAB — COMPREHENSIVE METABOLIC PANEL
ALT: 14 IU/L (ref 0–44)
AST: 28 IU/L (ref 0–40)
Albumin/Globulin Ratio: 1.4 (ref 1.2–2.2)
Albumin: 4.5 g/dL (ref 4.0–5.0)
Alkaline Phosphatase: 109 IU/L (ref 39–117)
BUN/Creatinine Ratio: 13 (ref 9–20)
BUN: 12 mg/dL (ref 6–24)
Bilirubin Total: 0.2 mg/dL (ref 0.0–1.2)
CO2: 22 mmol/L (ref 20–29)
Calcium: 9.2 mg/dL (ref 8.7–10.2)
Chloride: 99 mmol/L (ref 96–106)
Creatinine, Ser: 0.92 mg/dL (ref 0.76–1.27)
GFR calc Af Amer: 116 mL/min/{1.73_m2} (ref >59)
GFR calc non Af Amer: 100 mL/min/{1.73_m2} (ref >59)
Globulin, Total: 3.2 g/dL (ref 1.5–4.5)
Glucose: 128 mg/dL — ABNORMAL HIGH (ref 65–99)
Potassium: 3.8 mmol/L (ref 3.5–5.2)
Sodium: 138 mmol/L (ref 134–144)
Total Protein: 7.7 g/dL (ref 6.0–8.5)

## 2019-02-13 NOTE — Progress Notes (Deleted)
Cardiology Office Note:    Date:  02/13/2019   ID:  John Obrien, DOB 1975-10-10, MRN [id number]  PCP:  Shade Flood, MD  Cardiologist:  Norman Herrlich, MD    Referring MD: Shade Flood, MD    ASSESSMENT:    No diagnosis found. PLAN:    In order of problems listed above:  1. ***   Next appointment: ***   Medication Adjustments/Labs and Tests Ordered: Current medicines are reviewed at length with the patient today.  Concerns regarding medicines are outlined above.  No orders of the defined types were placed in this encounter.  No orders of the defined types were placed in this encounter.   No chief complaint on file.   History of Present Illness:    John Obrien is a 43 y.o. male with a hx of hypertension heart murmur suggestive of a bicuspid aortic valve and hyperlipidemia last seen 03/18/2018.  Echocardiogram ordered not performed and patient lost to follow-up.  Had recent primary care visit and referred back to my office.. Compliance with diet, lifestyle and medications: *** Past Medical History:  Diagnosis Date  . Hypertension     No past surgical history on file.  Current Medications: No outpatient medications have been marked as taking for the 02/14/19 encounter (Appointment) with Baldo Daub, MD.     Allergies:   Patient has no known allergies.   Social History   Socioeconomic History  . Marital status: Single    Spouse name: Not on file  . Number of children: Not on file  . Years of education: Not on file  . Highest education level: Not on file  Occupational History  . Not on file  Social Needs  . Financial resource strain: Not on file  . Food insecurity    Worry: Not on file    Inability: Not on file  . Transportation needs    Medical: Not on file    Non-medical: Not on file  Tobacco Use  . Smoking status: Never Smoker  . Smokeless tobacco: Never Used  Substance and Sexual Activity  . Alcohol use: Yes    Comment:  sometimes   . Drug use: Never  . Sexual activity: Not on file  Lifestyle  . Physical activity    Days per week: Not on file    Minutes per session: Not on file  . Stress: Not on file  Relationships  . Social Musician on phone: Not on file    Gets together: Not on file    Attends religious service: Not on file    Active member of club or organization: Not on file    Attends meetings of clubs or organizations: Not on file    Relationship status: Not on file  Other Topics Concern  . Not on file  Social History Narrative  . Not on file     Family History: The patient's ***family history includes Diabetes in his mother and sister; Stroke in his father. ROS:   Please see the history of present illness.    All other systems reviewed and are negative.  EKGs/Labs/Other Studies Reviewed:    The following studies were reviewed today:  EKG:  EKG ordered today and personally reviewed.  The ekg ordered today demonstrates ***  Recent Labs: 02/10/2019: ALT 14; BUN 12; Creatinine, Ser 0.92; Potassium 3.8; Sodium 138  Recent Lipid Panel    Component Value Date/Time   CHOL 260 (H) 02/10/2019 1459  TRIG 452 (H) 02/10/2019 1459   HDL 35 (L) 02/10/2019 1459   CHOLHDL 7.4 (H) 02/10/2019 1459   LDLCALC 141 (H) 02/10/2019 1459    Physical Exam:    VS:  There were no vitals taken for this visit.    Wt Readings from Last 3 Encounters:  02/10/19 153 lb 9.6 oz (69.7 kg)  03/18/18 156 lb 6.4 oz (70.9 kg)  01/05/18 153 lb 6.4 oz (69.6 kg)     GEN: *** Well nourished, well developed in no acute distress HEENT: Normal NECK: No JVD; No carotid bruits LYMPHATICS: No lymphadenopathy CARDIAC: ***RRR, no murmurs, rubs, gallops RESPIRATORY:  Clear to auscultation without rales, wheezing or rhonchi  ABDOMEN: Soft, non-tender, non-distended MUSCULOSKELETAL:  No edema; No deformity  SKIN: Warm and dry NEUROLOGIC:  Alert and oriented x 3 PSYCHIATRIC:  Normal affect    Signed,  Shirlee More, MD  02/13/2019 12:43 PM    [facility]

## 2019-02-14 ENCOUNTER — Ambulatory Visit: Payer: BLUE CROSS/BLUE SHIELD | Admitting: Cardiology

## 2019-02-16 ENCOUNTER — Encounter: Payer: BLUE CROSS/BLUE SHIELD | Admitting: Family Medicine

## 2019-02-23 ENCOUNTER — Encounter: Payer: BC Managed Care – PPO | Admitting: Family Medicine

## 2019-03-02 ENCOUNTER — Ambulatory Visit (INDEPENDENT_AMBULATORY_CARE_PROVIDER_SITE_OTHER): Payer: BC Managed Care – PPO | Admitting: Family Medicine

## 2019-03-02 ENCOUNTER — Encounter: Payer: Self-pay | Admitting: Family Medicine

## 2019-03-02 ENCOUNTER — Other Ambulatory Visit: Payer: Self-pay

## 2019-03-02 VITALS — BP 157/80 | HR 94 | Temp 98.4°F | Wt 148.6 lb

## 2019-03-02 DIAGNOSIS — I10 Essential (primary) hypertension: Secondary | ICD-10-CM

## 2019-03-02 DIAGNOSIS — R7303 Prediabetes: Secondary | ICD-10-CM

## 2019-03-02 DIAGNOSIS — Z0000 Encounter for general adult medical examination without abnormal findings: Secondary | ICD-10-CM

## 2019-03-02 DIAGNOSIS — E785 Hyperlipidemia, unspecified: Secondary | ICD-10-CM

## 2019-03-02 MED ORDER — AMLODIPINE BESYLATE 10 MG PO TABS: 10.0000 mg | ORAL_TABLET | Freq: Every day | ORAL | 3 refills | Status: DC

## 2019-03-02 MED ORDER — ATORVASTATIN CALCIUM 10 MG PO TABS: 10.0000 mg | ORAL_TABLET | Freq: Every day | ORAL | 0 refills | Status: DC

## 2019-03-02 NOTE — Progress Notes (Signed)
Subjective:  Patient ID: John Obrien, male    DOB: 1975-03-30  Age: 43 y.o. MRN: [id number]  CC:  Chief Complaint  Patient presents with   Annual Exam    doing well with no other issues to discuss at this time    HPI John Obrien presents for   Annual exam.     Hypertension: norvasc 5mg  qd.  Decreased control at November 19 visit with previous medication nonadherence.  Restarted amlodipine 5 mg daily.  Referred to cardiology for evaluation of heart murmur.  Appointment December 18. Alcohol: 8 drinks per week - 2 per day.  No IDU.  Home readings:none.  No missed doses amlodipine.  BP Readings from Last 3 Encounters:  03/02/19 (!) 157/80  02/10/19 (!) 175/107  03/18/18 (!) 148/76  repeat 157/80.   Lab Results  Component Value Date   CREATININE 0.92 02/10/2019    Elevated LFTs Recommended decreasing alcohol use.  Repeat testing improved last visit.  Lab Results  Component Value Date   ALT 14 02/10/2019   AST 28 02/10/2019   ALKPHOS 109 02/10/2019   BILITOT 0.2 02/10/2019    Hyperlipidemia: History of prediabetes, low intensity exercise discussed with repeat testing in 6 months.  May be contributing to hypertriglyceridemia. NOT fasting last visit.  Not currently on statin. The 10-year ASCVD risk score 02/12/2019 DC Denman George., et al., 2013) is: 12.4%   Values used to calculate the score:     Age: 57 years     Sex: Male     Is Non-Hispanic African American: Yes     Diabetic: No     Tobacco smoker: No     Systolic Blood Pressure: 157 mmHg     Is BP treated: Yes     HDL Cholesterol: 34 mg/dL     Total Cholesterol: 298 mg/dL  Lab Results  Component Value Date   CHOL 298 (H) 03/02/2019   HDL 34 (L) 03/02/2019   LDLCALC 184 (H) 03/02/2019   TRIG 399 (H) 03/02/2019   CHOLHDL 8.8 (H) 03/02/2019   Lab Results  Component Value Date   ALT 14 02/10/2019   AST 28 02/10/2019   ALKPHOS 109 02/10/2019   BILITOT 0.2 02/10/2019   Cancer screening Family history:  no colon, no known FH of colon CA.  Declines prostate cancer testing after r/b discussion.   Immunization History  Administered Date(s) Administered   Tdap 01/19/2017  Flu vaccine: declines.   Depression screen [facility] 2/9 03/02/2019 02/10/2019 01/05/2018 01/19/2017  Decreased Interest 0 0 0 0  Down, Depressed, Hopeless 0 0 0 0  PHQ - 2 Score 0 0 0 0    Hearing Screening   125Hz  250Hz  500Hz  1000Hz  2000Hz  3000Hz  4000Hz  6000Hz  8000Hz   Right ear:           Left ear:             Visual Acuity Screening   Right eye Left eye Both eyes  Without correction: 20/20 20/20 20/20   With correction:     Ophthalmology eval recommended last visit for intermittent blurring of vision. appt with optho in January - no further blurry vision.   Dental: every 6 months - delayed with pandemic - earlier this year.   Exercise: walking - 3 days per week, pushups. Situps.   STI testing - declines.   Prediabetes: Lab Results  Component Value Date   HGBA1C 6.3 (A) 02/10/2019     History Patient Active Problem List   Diagnosis Date  Noted   Murmur 02/28/2018   Essential hypertension 02/28/2018   Past Medical History:  Diagnosis Date   Hypertension    No past surgical history on file. No Known Allergies Prior to Admission medications   Medication Sig Start Date End Date Taking? Authorizing Provider  amLODipine (NORVASC) 5 MG tablet Take 1 tablet (5 mg total) by mouth daily. 02/10/19  Yes Shade Flood, MD   Social History   Socioeconomic History   Marital status: Single    Spouse name: Not on file   Number of children: Not on file   Years of education: Not on file   Highest education level: Not on file  Occupational History   Not on file  Tobacco Use   Smoking status: Never Smoker   Smokeless tobacco: Never Used  Substance and Sexual Activity   Alcohol use: Yes    Comment: sometimes    Drug use: Never   Sexual activity: Not on file  Other Topics Concern   Not on  file  Social History Narrative   Not on file   Social Determinants of Health   Financial Resource Strain:    Difficulty of Paying Living Expenses: Not on file  Food Insecurity:    Worried About Running Out of Food in the Last Year: Not on file   Ran Out of Food in the Last Year: Not on file  Transportation Needs:    Lack of Transportation (Medical): Not on file   Lack of Transportation (Non-Medical): Not on file  Physical Activity:    Days of Exercise per Week: Not on file   Minutes of Exercise per Session: Not on file  Stress:    Feeling of Stress : Not on file  Social Connections:    Frequency of Communication with Friends and Family: Not on file   Frequency of Social Gatherings with Friends and Family: Not on file   Attends Religious Services: Not on file   Active Member of Clubs or Organizations: Not on file   Attends Banker Meetings: Not on file   Marital Status: Not on file  Intimate Partner Violence:    Fear of Current or Ex-Partner: Not on file   Emotionally Abused: Not on file   Physically Abused: Not on file   Sexually Abused: Not on file    Review of Systems  13 point review of systems per patient health survey noted.  Negative other than as indicated above or in HPI.   Objective:   Vitals:   03/02/19 1540 03/02/19 1642  BP: (!) 173/81 (!) 157/80  Pulse: 94   Temp: 98.4 F (36.9 C)   TempSrc: Temporal   SpO2: 100%   Weight: 148 lb 9.6 oz (67.4 kg)      Physical Exam Vitals signs reviewed.  Constitutional:      Appearance: He is well-developed.  HENT:     Head: Normocephalic and atraumatic.     Right Ear: External ear normal.     Left Ear: External ear normal.  Eyes:     Conjunctiva/sclera: Conjunctivae normal.     Pupils: Pupils are equal, round, and reactive to light.  Neck:     Musculoskeletal: Normal range of motion and neck supple.     Thyroid: No thyromegaly.  Cardiovascular:     Rate and Rhythm: Normal  rate and regular rhythm.     Heart sounds: Normal heart sounds.  Pulmonary:     Effort: Pulmonary effort is normal. No respiratory distress.  Breath sounds: Normal breath sounds. No wheezing.  Abdominal:     General: There is no distension.     Palpations: Abdomen is soft.     Tenderness: There is no abdominal tenderness.  Musculoskeletal: Normal range of motion.        General: No tenderness.  Lymphadenopathy:     Cervical: No cervical adenopathy.  Skin:    General: Skin is warm and dry.  Neurological:     Mental Status: He is alert and oriented to person, place, and time.     Deep Tendon Reflexes: Reflexes are normal and symmetric.  Psychiatric:        Behavior: Behavior normal.      Assessment & Plan:  John Obrien is a 43 y.o. male . Annual physical exam  - -anticipatory guidance as below in AVS, screening labs above. Health maintenance items as above in HPI discussed/recommended as applicable.   Prediabetes  -Continue watch diet, exercise, recheck labs next few months.  Hyperlipidemia, unspecified hyperlipidemia type - Plan: Lipid panel, atorvastatin (LIPITOR) 10 MG tablet  Previous labs nonfasting, however anticipate still may need Lipitor.  Paper prescription provided, fill if updated lab work still elevated, then repeat lab 6 weeks.  Essential hypertension  -Decreased control will change Norvasc to 10 mg daily.  Follow-up with cardiology as planned.  Meds ordered this encounter  Medications   amLODipine (NORVASC) 10 MG tablet    Sig: Take 1 tablet (10 mg total) by mouth daily.    Dispense:  90 tablet    Refill:  3   atorvastatin (LIPITOR) 10 MG tablet    Sig: Take 1 tablet (10 mg total) by mouth daily.    Dispense:  90 tablet    Refill:  0   Patient Instructions    Increase amlodipdine to  per day. Keep follow up with cardiology as planned.  See info on prediabetes - we can recheck levels in next 3 months.  Start lipitor once per day for  cholesterol if today's labs are still elevated. I printed that to fill if elevated still.  Recheck labs in 6 weeks.   Prediabetes Prediabetes is the condition of having a blood sugar (blood glucose) level that is higher than it should be, but not high enough for you to be diagnosed with type 2 diabetes. Having prediabetes puts you at risk for developing type 2 diabetes (type 2 diabetes mellitus). Prediabetes may be called impaired glucose tolerance or impaired fasting glucose. Prediabetes usually does not cause symptoms. Your health care provider can diagnose this condition with blood tests. You may be tested for prediabetes if you are overweight and if you have at least one other risk factor for prediabetes. What is blood glucose, and how is it measured? Blood glucose refers to the amount of glucose in your bloodstream. Glucose comes from eating foods that contain sugars and starches (carbohydrates), which the body breaks down into glucose. Your blood glucose level may be measured in mg/dL (milligrams per deciliter) or mmol/L (millimoles per liter). Your blood glucose may be checked with one or more of the following blood tests:  A fasting blood glucose (FBG) test. You will not be allowed to eat (you will fast) for 8 hours or longer before a blood sample is taken. ? A normal range for FBG is 70-100 mg/dl (1.6-1.0 mmol/L).  An A1c (hemoglobin A1c) blood test. This test provides information about blood glucose control over the previous 2?3months.  An oral glucose tolerance test (OGTT).  This test measures your blood glucose at two times: ? After fasting. This is your baseline level. ? Two hours after you drink a beverage that contains glucose. You may be diagnosed with prediabetes:  If your FBG is 100?125 mg/dL (1.6-1.05.6-6.9 mmol/L).  If your A1c level is 5.7?6.4%.  If your OGTT result is 140?199 mg/dL (9.6-047.8-11 mmol/L). These blood tests may be repeated to confirm your diagnosis. How can this  condition affect me? The pancreas produces a hormone (insulin) that helps to move glucose from the bloodstream into cells. When cells in the body do not respond properly to insulin that the body makes (insulin resistance), excess glucose builds up in the blood instead of going into cells. As a result, high blood glucose (hyperglycemia) can develop, which can cause many complications. Hyperglycemia is a symptom of prediabetes. Having high blood glucose for a long time is dangerous. Too much glucose in your blood can damage your nerves and blood vessels. Long-term damage can lead to complications from diabetes, which may include:  Heart disease.  Stroke.  Blindness.  Kidney disease.  Depression.  Poor circulation in the feet and legs, which could lead to surgical removal (amputation) in severe cases. What can increase my risk? Risk factors for prediabetes include:  Having a family member with type 2 diabetes.  Being overweight or obese.  Being older than age 45.  Being of American [place], African-American, Hispanic/Latino, or Asian/Pacific Islander descent.  Having an inactive (sedentary) lifestyle.  Having a history of heart disease.  History of gestational diabetes or polycystic ovary syndrome (PCOS), in women.  Having low levels of good cholesterol (HDL-C) or high levels of blood fats (triglycerides).  Having high blood pressure. What actions can I take to prevent diabetes?      Be physically active. ? Do moderate-intensity physical activity for 30 or more minutes on 5 or more days of the week, or as much as told by your health care provider. This could be brisk walking, biking, or water aerobics. ? Ask your health care provider what activities are safe for you. A mix of physical activities may be best, such as walking, swimming, cycling, and strength training.  Lose weight as told by your health care provider. ? Losing 5-7% of your body weight can reverse insulin  resistance. ? Your health care provider can determine how much weight loss is best for you and can help you lose weight safely.  Follow a healthy meal plan. This includes eating lean proteins, complex carbohydrates, fresh fruits and vegetables, low-fat dairy products, and healthy fats. ? Follow instructions from your health care provider about eating or drinking restrictions. ? Make an appointment to see a diet and nutrition specialist (registered dietitian) to help you create a healthy eating plan that is right for you.  Do not smoke or use any tobacco products, such as cigarettes, chewing tobacco, and e-cigarettes. If you need help quitting, ask your health care provider.  Take over-the-counter and prescription medicines as told by your health care provider. You may be prescribed medicines that help lower the risk of type 2 diabetes.  Keep all follow-up visits as told by your health care provider. This is important. Summary  Prediabetes is the condition of having a blood sugar (blood glucose) level that is higher than it should be, but not high enough for you to be diagnosed with type 2 diabetes.  Having prediabetes puts you at risk for developing type 2 diabetes (type 2 diabetes mellitus).  To help  prevent type 2 diabetes, make lifestyle changes such as being physically active and eating a healthy diet. Lose weight as told by your health care provider. This information is not intended to replace advice given to you by your health care provider. Make sure you discuss any questions you have with your health care provider. Document Released: 07/02/2015 Document Revised: 07/02/2018 Document Reviewed: 05/01/2015 Elsevier Patient Education  2020 [place] you healthy  Get these tests  Blood pressure- Have your blood pressure checked once a year by your healthcare provider.  Normal blood pressure is 120/80.  Weight- Have your body mass index (BMI) calculated to screen for  obesity.  BMI is a measure of body fat based on height and weight. You can also calculate your own BMI at [employer]velBags.it.  Cholesterol- Have your cholesterol checked regularly starting at age 67, sooner may be necessary if you have diabetes, high blood pressure, if a family member developed heart diseases at an early age or if you smoke.   Chlamydia, HIV, and other sexual transmitted disease- Get screened each year until the age of 56 then within three months of each new sexual partner.  Diabetes- Have your blood sugar checked regularly if you have high blood pressure, high cholesterol, a family history of diabetes or if you are overweight.  Get these vaccines  Flu shot- Every fall.  Tetanus shot- Every 10 years.  Menactra- Single dose; prevents meningitis.  Take these steps  Don't smoke- If you do smoke, ask your healthcare provider about quitting. For tips on how to quit, go to www.smokefree.gov or call 1-800-QUIT-NOW.  Be physically active- Exercise 5 days a week for at least 30 minutes.  If you are not already physically active start slow and gradually work up to 30 minutes of moderate physical activity.  Examples of moderate activity include walking briskly, mowing the yard, dancing, swimming bicycling, etc.  Eat a healthy diet- Eat a variety of healthy foods such as fruits, vegetables, low fat milk, low fat cheese, yogurt, lean meats, poultry, fish, beans, tofu, etc.  For more information on healthy eating, go to www.thenutritionsource.org  Drink alcohol in moderation- Limit alcohol intake two drinks or less a day.  Never drink and drive.  Dentist- Brush and floss teeth twice daily; visit your dentis twice a year.  Depression-Your emotional health is as important as your physical health.  If you're feeling down, losing interest in things you normally enjoy please talk with your healthcare provider.  Gun Safety- If you keep a gun in your home, keep it unloaded and with  the safety lock on.  Bullets should be stored separately.  Helmet use- Always wear a helmet when riding a motorcycle, bicycle, rollerblading or skateboarding.  Safe sex- If you may be exposed to a sexually transmitted infection, use a condom  Seat belts- Seat bels can save your life; always wear one.  Smoke/Carbon Monoxide detectors- These detectors need to be installed on the appropriate level of your home.  Replace batteries at least once a year.  Skin Cancer- When out in the sun, cover up and use sunscreen SPF 15 or higher.  Violence- If anyone is threatening or hurting you, please tell your healthcare provider.   If you have lab work done today you will be contacted with your lab results within the next 2 weeks.  If you have not heard from [place] then please contact us. The fastest way to get your results is to register for  My Chart.   IF you received an x-ray today, you will receive an invoice from [facility]. Please contact [facility] at [phone] with questions or concerns regarding your invoice.   IF you received labwork today, you will receive an invoice from [facility]. Please contact [facility] at [phone] with questions or concerns regarding your invoice.   Our billing staff will not be able to assist you with questions regarding bills from these companies.  You will be contacted with the lab results as soon as they are available. The fastest way to get your results is to activate your My Chart account. Instructions are located on the last page of this paperwork. If you have not heard from [place] regarding the results in 2 weeks, please contact this office.          Signed, Meredith Staggers, MD [facility]

## 2019-03-02 NOTE — Patient Instructions (Addendum)
Increase amlodipdine to 10mg  per day. Keep follow up with cardiology as planned.  See info on prediabetes - we can recheck levels in next 3 months.  Start lipitor once per day for cholesterol if today's labs are still elevated. I printed that to fill if elevated still.  Recheck labs in 6 weeks.   Prediabetes Prediabetes is the condition of having a blood sugar (blood glucose) level that is higher than it should be, but not high enough for you to be diagnosed with type 2 diabetes. Having prediabetes puts you at risk for developing type 2 diabetes (type 2 diabetes mellitus). Prediabetes may be called impaired glucose tolerance or impaired fasting glucose. Prediabetes usually does not cause symptoms. Your health care provider can diagnose this condition with blood tests. You may be tested for prediabetes if you are overweight and if you have at least one other risk factor for prediabetes. What is blood glucose, and how is it measured? Blood glucose refers to the amount of glucose in your bloodstream. Glucose comes from eating foods that contain sugars and starches (carbohydrates), which the body breaks down into glucose. Your blood glucose level may be measured in mg/dL (milligrams per deciliter) or mmol/L (millimoles per liter). Your blood glucose may be checked with one or more of the following blood tests:  A fasting blood glucose (FBG) test. You will not be allowed to eat (you will fast) for 8 hours or longer before a blood sample is taken. ? A normal range for FBG is 70-100 mg/dl (3.9-5.6 mmol/L).  An A1c (hemoglobin A1c) blood test. This test provides information about blood glucose control over the previous 2?28months.  An oral glucose tolerance test (OGTT). This test measures your blood glucose at two times: ? After fasting. This is your baseline level. ? Two hours after you drink a beverage that contains glucose. You may be diagnosed with prediabetes:  If your FBG is 100?125 mg/dL  (5.6-6.9 mmol/L).  If your A1c level is 5.7?6.4%.  If your OGTT result is 140?199 mg/dL (7.8-11 mmol/L). These blood tests may be repeated to confirm your diagnosis. How can this condition affect me? The pancreas produces a hormone (insulin) that helps to move glucose from the bloodstream into cells. When cells in the body do not respond properly to insulin that the body makes (insulin resistance), excess glucose builds up in the blood instead of going into cells. As a result, high blood glucose (hyperglycemia) can develop, which can cause many complications. Hyperglycemia is a symptom of prediabetes. Having high blood glucose for a long time is dangerous. Too much glucose in your blood can damage your nerves and blood vessels. Long-term damage can lead to complications from diabetes, which may include:  Heart disease.  Stroke.  Blindness.  Kidney disease.  Depression.  Poor circulation in the feet and legs, which could lead to surgical removal (amputation) in severe cases. What can increase my risk? Risk factors for prediabetes include:  Having a family member with type 2 diabetes.  Being overweight or obese.  Being older than age 82.  Being of American [place], African-American, Hispanic/Latino, or Asian/Pacific Islander descent.  Having an inactive (sedentary) lifestyle.  Having a history of heart disease.  History of gestational diabetes or polycystic ovary syndrome (PCOS), in women.  Having low levels of good cholesterol (HDL-C) or high levels of blood fats (triglycerides).  Having high blood pressure. What actions can I take to prevent diabetes?      Be physically active. ? Do  moderate-intensity physical activity for 30 or more minutes on 5 or more days of the week, or as much as told by your health care provider. This could be brisk walking, biking, or water aerobics. ? Ask your health care provider what activities are safe for you. A mix of physical activities  may be best, such as walking, swimming, cycling, and strength training.  Lose weight as told by your health care provider. ? Losing 5-7% of your body weight can reverse insulin resistance. ? Your health care provider can determine how much weight loss is best for you and can help you lose weight safely.  Follow a healthy meal plan. This includes eating lean proteins, complex carbohydrates, fresh fruits and vegetables, low-fat dairy products, and healthy fats. ? Follow instructions from your health care provider about eating or drinking restrictions. ? Make an appointment to see a diet and nutrition specialist (registered dietitian) to help you create a healthy eating plan that is right for you.  Do not smoke or use any tobacco products, such as cigarettes, chewing tobacco, and e-cigarettes. If you need help quitting, ask your health care provider.  Take over-the-counter and prescription medicines as told by your health care provider. You may be prescribed medicines that help lower the risk of type 2 diabetes.  Keep all follow-up visits as told by your health care provider. This is important. Summary  Prediabetes is the condition of having a blood sugar (blood glucose) level that is higher than it should be, but not high enough for you to be diagnosed with type 2 diabetes.  Having prediabetes puts you at risk for developing type 2 diabetes (type 2 diabetes mellitus).  To help prevent type 2 diabetes, make lifestyle changes such as being physically active and eating a healthy diet. Lose weight as told by your health care provider. This information is not intended to replace advice given to you by your health care provider. Make sure you discuss any questions you have with your health care provider. Document Released: 07/02/2015 Document Revised: 07/02/2018 Document Reviewed: 05/01/2015 Elsevier Patient Education  2020 [employer].    Keeping you healthy  Get these tests  Blood  pressure- Have your blood pressure checked once a year by your healthcare provider.  Normal blood pressure is 120/80.  Weight- Have your body mass index (BMI) calculated to screen for obesity.  BMI is a measure of body fat based on height and weight. You can also calculate your own BMI at https://www.west-esparza.com/.  Cholesterol- Have your cholesterol checked regularly starting at age 85, sooner may be necessary if you have diabetes, high blood pressure, if a family member developed heart diseases at an early age or if you smoke.   Chlamydia, HIV, and other sexual transmitted disease- Get screened each year until the age of 47 then within three months of each new sexual partner.  Diabetes- Have your blood sugar checked regularly if you have high blood pressure, high cholesterol, a family history of diabetes or if you are overweight.  Get these vaccines  Flu shot- Every fall.  Tetanus shot- Every 10 years.  Menactra- Single dose; prevents meningitis.  Take these steps  Don't smoke- If you do smoke, ask your healthcare provider about quitting. For tips on how to quit, go to www.smokefree.gov or call 1-800-QUIT-NOW.  Be physically active- Exercise 5 days a week for at least 30 minutes.  If you are not already physically active start slow and gradually work up to 30 minutes of  moderate physical activity.  Examples of moderate activity include walking briskly, mowing the yard, dancing, swimming bicycling, etc.  Eat a healthy diet- Eat a variety of healthy foods such as fruits, vegetables, low fat milk, low fat cheese, yogurt, lean meats, poultry, fish, beans, tofu, etc.  For more information on healthy eating, go to www.thenutritionsource.org  Drink alcohol in moderation- Limit alcohol intake two drinks or less a day.  Never drink and drive.  Dentist- Brush and floss teeth twice daily; visit your dentis twice a year.  Depression-Your emotional health is as important as your physical health.   If you're feeling down, losing interest in things you normally enjoy please talk with your healthcare provider.  Gun Safety- If you keep a gun in your home, keep it unloaded and with the safety lock on.  Bullets should be stored separately.  Helmet use- Always wear a helmet when riding a motorcycle, bicycle, rollerblading or skateboarding.  Safe sex- If you may be exposed to a sexually transmitted infection, use a condom  Seat belts- Seat bels can save your life; always wear one.  Smoke/Carbon Monoxide detectors- These detectors need to be installed on the appropriate level of your home.  Replace batteries at least once a year.  Skin Cancer- When out in the sun, cover up and use sunscreen SPF 15 or higher.  Violence- If anyone is threatening or hurting you, please tell your healthcare provider.   If you have lab work done today you will be contacted with your lab results within the next 2 weeks.  If you have not heard from us then please contact us. The fastest way to get your results is to register for My Chart.   IF you received an x-ray today, you will receive an invoice from [facility]. Please contact [facility] at [phone] with questions or concerns regarding your invoice.   IF you received labwork today, you will receive an invoice from [facility]. Please contact [facility] at [phone] with questions or concerns regarding your invoice.   Our billing staff will not be able to assist you with questions regarding bills from these companies.  You will be contacted with the lab results as soon as they are available. The fastest way to get your results is to activate your My Chart account. Instructions are located on the last page of this paperwork. If you have not heard from us regarding the results in 2 weeks, please contact this office.

## 2019-03-03 ENCOUNTER — Encounter: Payer: Self-pay | Admitting: Family Medicine

## 2019-03-03 LAB — LIPID PANEL
Chol/HDL Ratio: 8.8 ratio — ABNORMAL HIGH (ref 0.0–5.0)
Cholesterol, Total: 298 mg/dL — ABNORMAL HIGH (ref 100–199)
HDL: 34 mg/dL — ABNORMAL LOW (ref >39)
LDL Chol Calc (NIH): 184 mg/dL — ABNORMAL HIGH (ref 0–99)
Triglycerides: 399 mg/dL — ABNORMAL HIGH (ref 0–149)
VLDL Cholesterol Cal: 80 mg/dL — ABNORMAL HIGH (ref 5–40)

## 2019-03-10 NOTE — Progress Notes (Signed)
Cardiology Office Note:    Date:  03/11/2019   ID:  John Obrien, DOB 1975-03-28, MRN [id number]  PCP:  Shade Flood, MD  Cardiologist:  Norman Herrlich, MD   Referring MD: Shade Flood, MD  ASSESSMENT:    1. Murmur   2. Essential hypertension   3. Hyperlipidemia, unspecified hyperlipidemia type    PLAN:    In order of problems listed above:  1. Seen 1 year ago in the office he has a systolic ejection murmur I suspect he has a bicuspid aortic valve and I am not sure how he did get the echocardiogram performed but will order today he said he would do it and if it confirms the findings he will need to initiate endocarditis prophylaxis will need follow-up echocardiograms at intervals.  His physical exam does not suggest severe valvular dysfunction 2. Blood pressure elevated today we will have him continue his current antihypertensive 3. Stable dyslipidemia continue statin  Next appointment   Medication Adjustments/Labs and Tests Ordered: Current medicines are reviewed at length with the patient today.  Concerns regarding medicines are outlined above.  No orders of the defined types were placed in this encounter.  No orders of the defined types were placed in this encounter.    Chief Complaint  Patient presents with  . Heart Murmur    History of Present Illness:    John Obrien is a 43 y.o. male who is being seen today for the evaluation of heart murmur at the request of Shade Flood, MD.  I had seen him a year ago I suspected a bicuspid valve echocardiogram ordered and neither of [place] are quite sure why but it was never performed.  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  He feels well.  He has had no fever or chills.  He has no known history of rheumatic or congenital heart disease.  I explained to him he requires an echocardiogram if he has a bicuspid valve we  need to follow endocarditis prophylaxis is a very high list risk lesion with a 10-15 fold relative increase in endocarditis and would need follow-up echocardiograms.   Past Medical History:  Diagnosis Date  . Hypertension     No past surgical history on file.  Current Medications: Current Meds  Medication Sig  . amLODipine (NORVASC) 10 MG tablet Take 1 tablet (10 mg total) by mouth daily.  Marland Kitchen atorvastatin (LIPITOR) 10 MG tablet Take 1 tablet (10 mg total) by mouth daily.     Allergies:   Patient has no known allergies.   Social History   Socioeconomic History  . Marital status: Single    Spouse name: Not on file  . Number of children: Not on file  . Years of education: Not on file  . Highest education level: Not on file  Occupational History  . Not on file  Tobacco Use  . Smoking status: Never Smoker  . Smokeless tobacco: Never Used  Substance and Sexual Activity  . Alcohol use: Yes    Comment: sometimes   . Drug use: Never  . Sexual activity: Not on file  Other Topics Concern  . Not on file  Social History Narrative  . Not on file   Social Determinants of Health   Financial Resource Strain:   . Difficulty of Paying Living Expenses: Not on file  Food Insecurity:   . Worried  About Running Out of Food in the Last Year: Not on file  . Ran Out of Food in the Last Year: Not on file  Transportation Needs:   . Lack of Transportation (Medical): Not on file  . Lack of Transportation (Non-Medical): Not on file  Physical Activity:   . Days of Exercise per Week: Not on file  . Minutes of Exercise per Session: Not on file  Stress:   . Feeling of Stress : Not on file  Social Connections:   . Frequency of Communication with Friends and Family: Not on file  . Frequency of Social Gatherings with Friends and Family: Not on file  . Attends Religious Services: Not on file  . Active Member of Clubs or Organizations: Not on file  . Attends Archivist Meetings: Not on  file  . Marital Status: Not on file     Family History: The patient's family history includes Diabetes in his mother and sister; Stroke in his father. His mother has unspecified valvular heart disease ROS:   ROS Please see the history of present illness.     All other systems reviewed and are negative.  EKGs/Labs/Other Studies Reviewed:    The following studies were reviewed today:   EKG:  EKG is  ordered today.  The ekg ordered today is personally reviewed and demonstrates normal except for left atrial enlargement  Recent Labs: 02/10/2019: ALT 14; BUN 12; Creatinine, Ser 0.92; Potassium 3.8; Sodium 138  Recent Lipid Panel    Component Value Date/Time   CHOL 298 (H) 03/02/2019 1707   TRIG 399 (H) 03/02/2019 1707   HDL 34 (L) 03/02/2019 1707   CHOLHDL 8.8 (H) 03/02/2019 1707   LDLCALC 184 (H) 03/02/2019 1707    Physical Exam:    VS:  BP (!) 154/90 (BP Location: Right Arm, Patient Position: Sitting, Cuff Size: Normal)   Pulse 97   Ht 5\' 4"  (1.626 m)   Wt 154 lb (69.9 kg)   SpO2 98%   BMI 26.43 kg/m     Wt Readings from Last 3 Encounters:  03/11/19 154 lb (69.9 kg)  03/02/19 148 lb 9.6 oz (67.4 kg)  02/10/19 153 lb 9.6 oz (69.7 kg)     GEN:  Well nourished, well developed in no acute distress HEENT: Normal NECK: No JVD; No carotid bruits LYMPHATICS: No lymphadenopathy CARDIAC: 1/6 to 2/6 midsystolic ejection murmur limited to the aortic area no AR S2 was normal.  RRR, no murmurs, rubs, gallops RESPIRATORY:  Clear to auscultation without rales, wheezing or rhonchi  ABDOMEN: Soft, non-tender, non-distended MUSCULOSKELETAL:  No edema; No deformity  SKIN: Warm and dry NEUROLOGIC:  Alert and oriented x 3 PSYCHIATRIC:  Normal affect     Signed, Shirlee More, MD  03/11/2019 10:04 AM    [place]

## 2019-03-11 ENCOUNTER — Encounter: Payer: Self-pay | Admitting: Cardiology

## 2019-03-11 ENCOUNTER — Ambulatory Visit (INDEPENDENT_AMBULATORY_CARE_PROVIDER_SITE_OTHER): Payer: BC Managed Care – PPO | Admitting: Cardiology

## 2019-03-11 ENCOUNTER — Other Ambulatory Visit: Payer: Self-pay

## 2019-03-11 VITALS — BP 154/90 | HR 97 | Ht 64.0 in | Wt 154.0 lb

## 2019-03-11 DIAGNOSIS — E785 Hyperlipidemia, unspecified: Secondary | ICD-10-CM | POA: Diagnosis not present

## 2019-03-11 DIAGNOSIS — R011 Cardiac murmur, unspecified: Secondary | ICD-10-CM | POA: Diagnosis not present

## 2019-03-11 DIAGNOSIS — I10 Essential (primary) hypertension: Secondary | ICD-10-CM

## 2019-03-11 NOTE — Patient Instructions (Signed)
Medication Instructions:  Your physician recommends that you continue on your current medications as directed. Please refer to the Current Medication list given to you today.  *If you need a refill on your cardiac medications before your next appointment, please call your pharmacy*  Lab Work: None   If you have labs (blood work) drawn today and your tests are completely normal, you will receive your results only by: Marland Kitchen MyChart Message (if you have MyChart) OR . A paper copy in the mail If you have any lab test that is abnormal or we need to change your treatment, we will call you to review the results.  Testing/Procedures: You had an EKG today.   Your physician has requested that you have an echocardiogram. Echocardiography is a painless test that uses sound waves to create images of your heart. It provides your doctor with information about the size and shape of your heart and how well your heart's chambers and valves are working. This procedure takes approximately one hour. There are no restrictions for this procedure.  Follow-Up: At [facility], you and your health needs are our priority.  As part of our continuing mission to provide you with exceptional heart care, we have created designated Provider Care Teams.  These Care Teams include your primary Cardiologist (physician) and Advanced Practice Providers (APPs -  Physician Assistants and Nurse Practitioners) who all work together to provide you with the care you need, when you need it.  Your next appointment:   6 week(s)  The format for your next appointment:   In Person  Provider:   Shirlee More, MD    Echocardiogram An echocardiogram is a procedure that uses painless sound waves (ultrasound) to produce an image of the heart. Images from an echocardiogram can provide important information about:  Signs of coronary artery disease (CAD).  Aneurysm detection. An aneurysm is a weak or damaged part of an artery wall that  bulges out from the normal force of blood pumping through the body.  Heart size and shape. Changes in the size or shape of the heart can be associated with certain conditions, including heart failure, aneurysm, and CAD.  Heart muscle function.  Heart valve function.  Signs of a past heart attack.  Fluid buildup around the heart.  Thickening of the heart muscle.  A tumor or infectious growth around the heart valves. Tell a health care provider about:  Any allergies you have.  All medicines you are taking, including vitamins, herbs, eye drops, creams, and over-the-counter medicines.  Any blood disorders you have.  Any surgeries you have had.  Any medical conditions you have.  Whether you are pregnant or may be pregnant. What are the risks? Generally, this is a safe procedure. However, problems may occur, including:  Allergic reaction to dye (contrast) that may be used during the procedure. What happens before the procedure? No specific preparation is needed. You may eat and drink normally. What happens during the procedure?   An IV tube may be inserted into one of your veins.  You may receive contrast through this tube. A contrast is an injection that improves the quality of the pictures from your heart.  A gel will be applied to your chest.  A wand-like tool (transducer) will be moved over your chest. The gel will help to transmit the sound waves from the transducer.  The sound waves will harmlessly bounce off of your heart to allow the heart images to be captured in real-time motion. The  images will be recorded on a computer. The procedure may vary among health care providers and hospitals. What happens after the procedure?  You may return to your normal, everyday life, including diet, activities, and medicines, unless your health care provider tells you not to do that. Summary  An echocardiogram is a procedure that uses painless sound waves (ultrasound) to produce  an image of the heart.  Images from an echocardiogram can provide important information about the size and shape of your heart, heart muscle function, heart valve function, and fluid buildup around your heart.  You do not need to do anything to prepare before this procedure. You may eat and drink normally.  After the echocardiogram is completed, you may return to your normal, everyday life, unless your health care provider tells you not to do that. This information is not intended to replace advice given to you by your health care provider. Make sure you discuss any questions you have with your health care provider. Document Released: 03/07/2000 Document Revised: 07/01/2018 Document Reviewed: 04/12/2016 Elsevier Patient Education  2020 [employer].

## 2019-03-15 ENCOUNTER — Ambulatory Visit (HOSPITAL_BASED_OUTPATIENT_CLINIC_OR_DEPARTMENT_OTHER)
Admission: RE | Admit: 2019-03-15 | Discharge: 2019-03-15 | Disposition: A | Discharge status: Home / Self Care | Payer: BC Managed Care – PPO | Source: Ambulatory Visit | Attending: Cardiology | Admitting: Cardiology

## 2019-03-15 ENCOUNTER — Other Ambulatory Visit: Payer: Self-pay

## 2019-03-15 DIAGNOSIS — R011 Cardiac murmur, unspecified: Secondary | ICD-10-CM

## 2019-03-15 DIAGNOSIS — E785 Hyperlipidemia, unspecified: Secondary | ICD-10-CM

## 2019-03-15 DIAGNOSIS — I10 Essential (primary) hypertension: Secondary | ICD-10-CM | POA: Diagnosis not present

## 2019-03-15 NOTE — Progress Notes (Signed)
  Echocardiogram    2D Echocardiogram has been performed.  03/15/2019, 11:47 AM

## 2019-03-17 ENCOUNTER — Telehealth: Payer: Self-pay | Admitting: *Deleted

## 2019-03-17 NOTE — Telephone Encounter (Signed)
-----   Message from Richardo Priest, MD sent at 03/16/2019  2:11 PM EST ----- Normal or stable result  Good report  Mild valve leakage, AR, and mild changes of high BP, LVH, otherwise good, overall a good report

## 2019-03-17 NOTE — Telephone Encounter (Signed)
Telephone call to patient. Left message with echo results and to call with any questions. 

## 2019-04-13 ENCOUNTER — Ambulatory Visit: Payer: BC Managed Care – PPO | Admitting: Family Medicine

## 2019-04-13 ENCOUNTER — Encounter: Payer: Self-pay | Admitting: Family Medicine

## 2019-04-13 ENCOUNTER — Other Ambulatory Visit: Payer: Self-pay

## 2019-04-13 VITALS — BP 138/80 | HR 100 | Temp 98.5°F | Ht 64.0 in | Wt 148.4 lb

## 2019-04-13 DIAGNOSIS — I10 Essential (primary) hypertension: Secondary | ICD-10-CM | POA: Diagnosis not present

## 2019-04-13 DIAGNOSIS — E785 Hyperlipidemia, unspecified: Secondary | ICD-10-CM | POA: Diagnosis not present

## 2019-04-13 MED ORDER — HYDROCHLOROTHIAZIDE 12.5 MG PO CAPS: 12.5000 mg | ORAL_CAPSULE | Freq: Every day | ORAL | 1 refills | Status: DC

## 2019-04-13 NOTE — Progress Notes (Signed)
Subjective:  Patient ID: John Obrien, male    DOB: Apr 18, 1975  Age: 44 y.o. MRN: [id number]  CC:  Chief Complaint  Patient presents with  . Follow-up    on hyper tension and hyperlipidemia. pt has no physical sympotoms of ether of these conditions. pt states he feels good with no complaints.    HPI John Obrien presents for   Hypertension: Amlodipine 10 mg daily - taking 2 per day (misunderstood - 2 of the 5mg  until picked up 10mg  pill). Increased last visit.  Saw cardiology 12/18, note reviewed. - suspsected bicuspid aortic valve, but echo12/22 indicated normal aortic valve. Mild AR, mild changes of high BP. LVH, normal otherwise.  Home readings:none.  No new side effects, no ankle swelling.  BP Readings from Last 3 Encounters:  04/13/19 138/80  03/11/19 (!) 154/90  03/02/19 (!) 157/80   Lab Results  Component Value Date   CREATININE 0.92 02/10/2019   Hyperlipidemia: Lipitor 10 mg daily. Started after December visit. No side effects, no myalgias.  Taking daily.  Fasting today.  Lab Results  Component Value Date   CHOL 298 (H) 03/02/2019   HDL 34 (L) 03/02/2019   LDLCALC 184 (H) 03/02/2019   TRIG 399 (H) 03/02/2019   CHOLHDL 8.8 (H) 03/02/2019   Lab Results  Component Value Date   ALT 14 02/10/2019   AST 28 02/10/2019   ALKPHOS 109 02/10/2019   BILITOT 0.2 02/10/2019       History Patient Active Problem List   Diagnosis Date Noted  . Murmur 02/28/2018  . Essential hypertension 02/28/2018   Past Medical History:  Diagnosis Date  . Hypertension    History reviewed. No pertinent surgical history. No Known Allergies Prior to Admission medications   Medication Sig Start Date End Date Taking? Authorizing Provider  amLODipine (NORVASC) 10 MG tablet Take 1 tablet (10 mg total) by mouth daily. 03/02/19  Yes 14/10/2017, MD  atorvastatin (LIPITOR) 10 MG tablet Take 1 tablet (10 mg total) by mouth daily. 03/02/19  Yes Shade Flood, MD    Social History   Socioeconomic History  . Marital status: Single    Spouse name: Not on file  . Number of children: Not on file  . Years of education: Not on file  . Highest education level: Not on file  Occupational History  . Not on file  Tobacco Use  . Smoking status: Never Smoker  . Smokeless tobacco: Never Used  Substance and Sexual Activity  . Alcohol use: Yes    Comment: sometimes   . Drug use: Never  . Sexual activity: Not on file  Other Topics Concern  . Not on file  Social History Narrative  . Not on file   Social Determinants of Health   Financial Resource Strain:   . Difficulty of Paying Living Expenses: Not on file  Food Insecurity:   . Worried About 14/9/20 in the Last Year: Not on file  . Ran Out of Food in the Last Year: Not on file  Transportation Needs:   . Lack of Transportation (Medical): Not on file  . Lack of Transportation (Non-Medical): Not on file  Physical Activity:   . Days of Exercise per Week: Not on file  . Minutes of Exercise per Session: Not on file  Stress:   . Feeling of Stress : Not on file  Social Connections:   . Frequency of Communication with Friends and Family: Not on file  .  Frequency of Social Gatherings with Friends and Family: Not on file  . Attends Religious Services: Not on file  . Active Member of Clubs or Organizations: Not on file  . Attends Banker Meetings: Not on file  . Marital Status: Not on file  Intimate Partner Violence:   . Fear of Current or Ex-Partner: Not on file  . Emotionally Abused: Not on file  . Physically Abused: Not on file  . Sexually Abused: Not on file    Review of Systems  Constitutional: Negative for fatigue and unexpected weight change.  Eyes: Negative for visual disturbance.  Respiratory: Negative for cough, chest tightness and shortness of breath.   Cardiovascular: Negative for chest pain, palpitations and leg swelling.  Gastrointestinal: Negative for  abdominal pain and blood in stool.  Neurological: Negative for dizziness, light-headedness and headaches.     Objective:   Vitals:   04/13/19 0954 04/13/19 0959  BP: (!) 146/85 138/80  Pulse: 100   Temp: 98.5 F (36.9 C)   TempSrc: Temporal   SpO2: 99%   Weight: 148 lb 6.4 oz (67.3 kg)   Height: 5\' 4"  (1.626 m)      Physical Exam Vitals reviewed.  Constitutional:      Appearance: He is well-developed.  HENT:     Head: Normocephalic and atraumatic.  Eyes:     Pupils: Pupils are equal, round, and reactive to light.  Neck:     Vascular: No carotid bruit or JVD.  Cardiovascular:     Rate and Rhythm: Normal rate and regular rhythm.     Heart sounds: Murmur present.  Pulmonary:     Effort: Pulmonary effort is normal.     Breath sounds: Normal breath sounds. No rales.  Musculoskeletal:     Right lower leg: No edema.     Left lower leg: No edema.  Skin:    General: Skin is warm and dry.  Neurological:     Mental Status: He is alert and oriented to person, place, and time.      Assessment & Plan:  John Obrien is a 44 y.o. male . Hyperlipidemia, unspecified hyperlipidemia type - Plan: Lipid panel, Comprehensive metabolic panel - tolerating statin. Repeat labs, anticipate higher dose to be prescribed.   Essential hypertension - Plan: hydrochlorothiazide (MICROZIDE) 12.5 MG capsule, Comprehensive metabolic panel   - improved, but on double dose of amlodipine. Discussed max dose 10mg . rx hctz 12.5mg  qd if elevated home readings. Possible side effects and orthostatic precautions, rtc precautions given.   Recheck 3 months.   Meds ordered this encounter  Medications  . hydrochlorothiazide (MICROZIDE) 12.5 MG capsule    Sig: Take 1 capsule (12.5 mg total) by mouth daily.    Dispense:  90 capsule    Refill:  1   Patient Instructions    Decrease amlodpine to max dose of 10mg  per day. Keep a record of your blood pressures outside of the office and if over 140/90 -  start additional pill once per day (hydrochlorothiazide). Recheck in 3 months.   Will check cholesterol again today.    If you have lab work done today you will be contacted with your lab results within the next 2 weeks.  If you have not heard from 54 then please contact . The fastest way to get your results is to register for My Chart.   IF you received an x-ray today, you will receive an invoice from [facility]. Please contact [facility] at [phone]  with questions or concerns regarding your invoice.   IF you received labwork today, you will receive an invoice from [place]. Please contact [facility] at [phone] with questions or concerns regarding your invoice.   Our billing staff will not be able to assist you with questions regarding bills from these companies.  You will be contacted with the lab results as soon as they are available. The fastest way to get your results is to activate your My Chart account. Instructions are located on the last page of this paperwork. If you have not heard from [place] regarding the results in 2 weeks, please contact this office.         Signed, Merri Ray, MD [facility]

## 2019-04-13 NOTE — Patient Instructions (Addendum)
Decrease amlodpine to max dose of 10mg  per day. Keep a record of your blood pressures outside of the office and if over 140/90 - start additional pill once per day (hydrochlorothiazide). Recheck in 3 months.   Will check cholesterol again today.    How to Take Your Blood Pressure Blood pressure is a measurement of how strongly your blood is pressing against the walls of your arteries. Arteries are blood vessels that carry blood from your heart throughout your body. Your health care provider takes your blood pressure at each office visit. You can also take your own blood pressure at home with a blood pressure machine. You may need to take your own blood pressure:  To confirm a diagnosis of high blood pressure (hypertension).  To monitor your blood pressure over time.  To make sure your blood pressure medicine is working. Supplies needed: To take your blood pressure, you will need a blood pressure machine. You can buy a blood pressure machine, or blood pressure monitor, at most drugstores or online. There are several types of home blood pressure monitors. When choosing one, consider the following:  Choose a monitor that has an arm cuff.  Choose a cuff that wraps snugly around your upper arm. You should be able to fit only one finger between your arm and the cuff.  Do not choose a monitor that measures your blood pressure from your wrist or finger. Your health care provider can suggest a reliable monitor that will meet your needs. How to prepare To get the most accurate reading, avoid the following for 30 minutes before you check your blood pressure:  Drinking caffeine.  Drinking alcohol.  Eating.  Smoking.  Exercising. Five minutes before you check your blood pressure:  Empty your bladder.  Sit quietly without talking in a dining chair, rather than in a soft couch or armchair. How to take your blood pressure To check your blood pressure, follow the instructions in the manual  that came with your blood pressure monitor. If you have a digital blood pressure monitor, the instructions may be as follows: 1. Sit up straight. 2. Place your feet on the floor. Do not cross your ankles or legs. 3. Rest your left arm at the level of your heart on a table or desk or on the arm of a chair. 4. Pull up your shirt sleeve. 5. Wrap the blood pressure cuff around the upper part of your left arm, 1 inch (2.5 cm) above your elbow. It is best to wrap the cuff around bare skin. 6. Fit the cuff snugly around your arm. You should be able to place only one finger between the cuff and your arm. 7. Position the cord inside the groove of your elbow. 8. Press the power button. 9. Sit quietly while the cuff inflates and deflates. 10. Read the digital reading on the monitor screen and write it down (record it). 11. Wait 2-3 minutes, then repeat the steps, starting at step 1. What does my blood pressure reading mean? A blood pressure reading consists of a higher number over a lower number. Ideally, your blood pressure should be below 120/80. The first ("top") number is called the systolic pressure. It is a measure of the pressure in your arteries as your heart beats. The second ("bottom") number is called the diastolic pressure. It is a measure of the pressure in your arteries as the heart relaxes. Blood pressure is classified into four stages. The following are the stages for adults who do  not have a short-term serious illness or a chronic condition. Systolic pressure and diastolic pressure are measured in a unit called mm Hg. Normal  Systolic pressure: below 557.  Diastolic pressure: below 80. Elevated  Systolic pressure: 322-025.  Diastolic pressure: below 80. Hypertension stage 1  Systolic pressure: 427-062.  Diastolic pressure: 37-62. Hypertension stage 2  Systolic pressure: 831 or above.  Diastolic pressure: 90 or above. You can have prehypertension or hypertension even if only  the systolic or only the diastolic number in your reading is higher than normal. Follow these instructions at home:  Check your blood pressure as often as recommended by your health care provider.  Take your monitor to the next appointment with your health care provider to make sure: ? That you are using it correctly. ? That it provides accurate readings.  Be sure you understand what your goal blood pressure numbers are.  Tell your health care provider if you are having any side effects from blood pressure medicine. Contact a health care provider if:  Your blood pressure is consistently high. Get help right away if:  Your systolic blood pressure is higher than 180.  Your diastolic blood pressure is higher than 110. This information is not intended to replace advice given to you by your health care provider. Make sure you discuss any questions you have with your health care provider. Document Revised: 02/20/2017 Document Reviewed: 08/17/2015 Elsevier Patient Education  [employer].    If you have lab work done today you will be contacted with your lab results within the next 2 weeks.  If you have not heard from [place] then please contact us. The fastest way to get your results is to register for My Chart.   IF you received an x-ray today, you will receive an invoice from [facility]. Please contact [facility] at [phone] with questions or concerns regarding your invoice.   IF you received labwork today, you will receive an invoice from [place]. Please contact [facility] at [phone] with questions or concerns regarding your invoice.   Our billing staff will not be able to assist you with questions regarding bills from these companies.  You will be contacted with the lab results as soon as they are available. The fastest way to get your results is to activate your My Chart account. Instructions are located on the last page of this paperwork. If you have  not heard from [place] regarding the results in 2 weeks, please contact this office.

## 2019-04-14 LAB — LIPID PANEL
Chol/HDL Ratio: 4.4 ratio (ref 0.0–5.0)
Cholesterol, Total: 220 mg/dL — ABNORMAL HIGH (ref 100–199)
HDL: 50 mg/dL (ref >39)
LDL Chol Calc (NIH): 120 mg/dL — ABNORMAL HIGH (ref 0–99)
Triglycerides: 285 mg/dL — ABNORMAL HIGH (ref 0–149)
VLDL Cholesterol Cal: 50 mg/dL — ABNORMAL HIGH (ref 5–40)

## 2019-04-14 LAB — COMPREHENSIVE METABOLIC PANEL
ALT: 14 IU/L (ref 0–44)
AST: 30 IU/L (ref 0–40)
Albumin/Globulin Ratio: 1.4 (ref 1.2–2.2)
Albumin: 4.8 g/dL (ref 4.0–5.0)
Alkaline Phosphatase: 131 IU/L — ABNORMAL HIGH (ref 39–117)
BUN/Creatinine Ratio: 8 — ABNORMAL LOW (ref 9–20)
BUN: 7 mg/dL (ref 6–24)
Bilirubin Total: 0.2 mg/dL (ref 0.0–1.2)
CO2: 24 mmol/L (ref 20–29)
Calcium: 9.8 mg/dL (ref 8.7–10.2)
Chloride: 97 mmol/L (ref 96–106)
Creatinine, Ser: 0.91 mg/dL (ref 0.76–1.27)
GFR calc Af Amer: 117 mL/min/{1.73_m2} (ref >59)
GFR calc non Af Amer: 101 mL/min/{1.73_m2} (ref >59)
Globulin, Total: 3.5 g/dL (ref 1.5–4.5)
Glucose: 118 mg/dL — ABNORMAL HIGH (ref 65–99)
Potassium: 4 mmol/L (ref 3.5–5.2)
Sodium: 137 mmol/L (ref 134–144)
Total Protein: 8.3 g/dL (ref 6.0–8.5)

## 2019-04-27 NOTE — Progress Notes (Signed)
Cardiology Office Note:    Date:  04/28/2019   ID:  John Obrien, DOB 1975/08/17, MRN [id number]  PCP:  John Flood, MD   please do a LP(a) with his next lipid profile as this is associated with rapid progression of valve dysfunction in a subset with a bicuspid aortic valve and his aortic regurgitation is unusual at his age due to aortic valve sclerosis Cardiologist:  John Herrlich, MD    Referring MD: John Flood, MD    ASSESSMENT:    1. Nonrheumatic aortic valve insufficiency   2. Essential hypertension    PLAN:    In order of problems listed above:  1. Due to aortic valve thickening and sclerosis it seems premature at his age group but in the context of hypertension hyperlipidemia I think he should start endocarditis prophylaxis and like to see him back in the office in 2 years. 2. Stable continue current treatment I encouraged him to purchase a blood pressure cuff and monitor at home   Next appointment: 2 years   Medication Adjustments/Labs and Tests Ordered: Current medicines are reviewed at length with the patient today.  Concerns regarding medicines are outlined above.  No orders of the defined types were placed in this encounter.  No orders of the defined types were placed in this encounter.   Chief Complaint  Patient presents with  . Aortic Insuffiency  . Hypertension    History of Present Illness:    John Obrien is a 44 y.o. male with a hx of heart murmur last seen 03/11/2019. Compliance with diet, lifestyle and medications: Yes and is restricting sodium  I reviewed his echocardiogram 03/15/2019.  Showed the presence of mild aortic regurgitation and mild aortic valve sclerosis the valve itself was not bicuspid.  Left ventricular function was normal and there is mild left atrial enlargement.  He has no history of chest trauma previous endocarditis or radiation.  I sat with him reviewed his echocardiogram told him I do not foresee in the near  future that he need any valve interventions but I do think he should start endocarditis prophylaxis and was given a prescription for amoxicillin.  In today's world we do it for high risk individuals and I would define him in that group.  I told him it is a little bit uncommon in his age group and is likely a sign that he needs his hypertension and hyperlipidemia well controlled from this point forward and he voiced understanding he is not having shortness of breath exercise intolerance or chest pain. Past Medical History:  Diagnosis Date  . Hypertension     History reviewed. No pertinent surgical history.  Current Medications: No outpatient medications have been marked as taking for the 04/28/19 encounter (Office Visit) with Baldo Daub, MD.     Allergies:   Patient has no known allergies.   Social History   Socioeconomic History  . Marital status: Single    Spouse name: Not on file  . Number of children: Not on file  . Years of education: Not on file  . Highest education level: Not on file  Occupational History  . Not on file  Tobacco Use  . Smoking status: Never Smoker  . Smokeless tobacco: Never Used  Substance and Sexual Activity  . Alcohol use: Yes    Comment: sometimes   . Drug use: Never  . Sexual activity: Not on file  Other Topics Concern  . Not on file  Social History  Narrative  . Not on file   Social Determinants of Health   Financial Resource Strain:   . Difficulty of Paying Living Expenses: Not on file  Food Insecurity:   . Worried About Charity fundraiser in the Last Year: Not on file  . Ran Out of Food in the Last Year: Not on file  Transportation Needs:   . Lack of Transportation (Medical): Not on file  . Lack of Transportation (Non-Medical): Not on file  Physical Activity:   . Days of Exercise per Week: Not on file  . Minutes of Exercise per Session: Not on file  Stress:   . Feeling of Stress : Not on file  Social Connections:   . Frequency of  Communication with Friends and Family: Not on file  . Frequency of Social Gatherings with Friends and Family: Not on file  . Attends Religious Services: Not on file  . Active Member of Clubs or Organizations: Not on file  . Attends Archivist Meetings: Not on file  . Marital Status: Not on file     Family History: The patient's family history includes Diabetes in his mother and sister; Stroke in his father. ROS:   Please see the history of present illness.    All other systems reviewed and are negative.  EKGs/Labs/Other Studies Reviewed:    The following studies were reviewed today:   Recent Labs: 04/13/2019: ALT 14; BUN 7; Creatinine, Ser 0.91; Potassium 4.0; Sodium 137  Recent Lipid Panel    Component Value Date/Time   CHOL 220 (H) 04/13/2019 1102   TRIG 285 (H) 04/13/2019 1102   HDL 50 04/13/2019 1102   CHOLHDL 4.4 04/13/2019 1102   LDLCALC 120 (H) 04/13/2019 1102    Physical Exam:    VS:  BP (!) 154/96   Pulse 99   Ht 5\' 4"  (1.626 m)   Wt 149 lb (67.6 kg)   SpO2 95%   BMI 25.58 kg/m     Wt Readings from Last 3 Encounters:  04/28/19 149 lb (67.6 kg)  04/13/19 148 lb 6.4 oz (67.3 kg)  03/11/19 154 lb (69.9 kg)     GEN:  Well nourished, well developed in no acute distress HEENT: Normal NECK: No JVD; No carotid bruits LYMPHATICS: No lymphadenopathy CARDIAC: He has grade 1-2 aortic outflow murmur and 1/6 aortic regurgitation aortic area RRR, no murmurs, rubs, gallops RESPIRATORY:  Clear to auscultation without rales, wheezing or rhonchi  ABDOMEN: Soft, non-tender, non-distended MUSCULOSKELETAL:  No edema; No deformity  SKIN: Warm and dry NEUROLOGIC:  Alert and oriented x 3 PSYCHIATRIC:  Normal affect    Signed, Shirlee More, MD  04/28/2019 8:57 AM    [place]

## 2019-04-28 ENCOUNTER — Encounter: Payer: Self-pay | Admitting: Cardiology

## 2019-04-28 ENCOUNTER — Ambulatory Visit (INDEPENDENT_AMBULATORY_CARE_PROVIDER_SITE_OTHER): Payer: BC Managed Care – PPO | Admitting: Cardiology

## 2019-04-28 ENCOUNTER — Other Ambulatory Visit: Payer: Self-pay

## 2019-04-28 VITALS — BP 142/80 | HR 99 | Ht 64.0 in | Wt 149.0 lb

## 2019-04-28 DIAGNOSIS — I10 Essential (primary) hypertension: Secondary | ICD-10-CM | POA: Diagnosis not present

## 2019-04-28 DIAGNOSIS — I351 Nonrheumatic aortic (valve) insufficiency: Secondary | ICD-10-CM

## 2019-04-28 MED ORDER — AMOXICILLIN 500 MG PO CAPS: 2000.0000 mg | ORAL_CAPSULE | Freq: Once | ORAL | 0 refills | Status: AC

## 2019-04-28 NOTE — Patient Instructions (Addendum)
Medication Instructions:  Your physician has recommended you make the following change in your medication:   TAKE amoxicillin 500mg  take 4 tablets=2000 mg 1 hour prior to visit  *If you need a refill on your cardiac medications before your next appointment, please call your pharmacy*  Lab Work: NONE If you have labs (blood work) drawn today and your tests are completely normal, you will receive your results only by: MyChart Message (if you have MyChart) OR . A paper copy in the mail If you have any lab test that is abnormal or we need to change your treatment, we will call you to review the results.  Testing/Procedures: NONE  Follow-Up: At [facility], you and your health needs are our priority.  As part of our continuing mission to provide you with exceptional heart care, we have created designated Provider Care Teams.  These Care Teams include your primary Cardiologist (physician) and Advanced Practice Providers (APPs -  Physician Assistants and Nurse Practitioners) who all work together to provide you with the care you need, when you need it.  Your next appointment:   2 year(s)  The format for your next appointment:   In Person  Provider:   [facility] MD  Other Instructions Amoxicillin capsules or tablets What is this medicine? AMOXICILLIN (a mox i SIL in) is a penicillin antibiotic. It is used to treat certain kinds of bacterial infections. It will not work for colds, flu, or other viral infections. This medicine may be used for other purposes; ask your health care provider or pharmacist if you have questions. COMMON BRAND NAME(S): Amoxil, Moxilin, Sumox, Trimox What should I tell my health care provider before I take this medicine? They need to know if you have any of these conditions:  kidney disease  an unusual or allergic reaction to amoxicillin, other penicillins, cephalosporin antibiotics, other medicines, foods, dyes, or preservatives  pregnant or trying  to get pregnant  breast-feeding How should I use this medicine? Take this medicine by mouth with a glass of water. Follow the directions on your prescription label. You can take it with or without food. If it upsets your stomach, take it with food. Take your medicine at regular intervals. Do not take your medicine more often than directed. Take all of your medicine as directed even if you think you are better. Do not skip doses or stop your medicine early. Talk to your pediatrician regarding the use of this medicine in children. While this drug may be prescribed for selected conditions, precautions do apply. Overdosage: If you think you have taken too much of this medicine contact a poison control center or emergency room at once. NOTE: This medicine is only for you. Do not share this medicine with others. What if I miss a dose? If you miss a dose, take it as soon as you can. If it is almost time for your next dose, take only that dose. Do not take double or extra doses. What may interact with this medicine?  allopurinol  birth control pills  certain antibiotics like chloramphenicol, erythromycin, sulfamethoxazole, tetracycline  certain medicines that treat or prevent blood clots like warfarin This list may not describe all possible interactions. Give your health care provider a list of all the medicines, herbs, non-prescription drugs, or dietary supplements you use. Also tell them if you smoke, drink alcohol, or use illegal drugs. Some items may interact with your medicine. What should I watch for while using this medicine? Tell your health care  professional if your symptoms do not start to get better or if they get worse. Do not treat diarrhea with over the counter products. Contact your health care professional if you have diarrhea that lasts more than 2 days or if it is severe and watery. If you have diabetes, you may get a false-positive result for sugar in your urine. Check with your  health care professional. Birth control may not work properly while you are taking this medicine. Talk to your health care professional about using an extra method of birth control. This medicine may cause serious skin reactions. They can happen weeks to months after starting the medicine. Contact your health care provider right away if you notice fevers or flu-like symptoms with a rash. The rash may be red or purple and then turn into blisters or peeling of the skin. Or, you might notice a red rash with swelling of the face, lips or lymph nodes in your neck or under your arms. What side effects may I notice from receiving this medicine? Side effects that you should report to your doctor or health care professional as soon as possible:  allergic reactions like skin rash, itching or hives, swelling of the face, lips, or tongue  bloody or watery diarrhea  breathing problems  feeling faint; lightheaded, falls  fever  redness, blistering, peeling or loosening of the skin, including inside the mouth  seizures  signs and symptoms of kidney injury like trouble passing urine or change in the amount of urine  signs and symptoms of liver injury like dark yellow or brown urine; general ill feeling or flu-like symptoms; light-colored stools; loss of appetite; nausea; right upper belly pain; unusually weak or tired; yellowing of the eyes or skin  unusual bleeding or bruising  unusually weak or tired Side effects that usually do not require medical attention (report to your doctor or health care professional if they continue or are bothersome):  anxious  confusion  diarrhea  dizziness  headache  nausea, vomiting  stomach upset  trouble sleeping This list may not describe all possible side effects. Call your doctor for medical advice about side effects. You may report side effects to FDA at 1-800-FDA-1088. Where should I keep my medicine? Keep out of the reach of children. Store at  room temperature between 20 and 25 degrees C (68 and 77 degrees F). Throw away any unused medicine after the expiration date. NOTE: This sheet is a summary. It may not cover all possible information. If you have questions about this medicine, talk to your doctor, pharmacist, or health care provider.  2020 Elsevier/Gold Standard (2018-05-21 14:32:29)     Aortic Valve Regurgitation  Aortic valve regurgitation is a condition that happens when the aortic valve does not close all the way. The aortic valve is a gate-like structure between the lower left chamber of the heart (left ventricle) and the main blood vessel that supplies blood to the rest of the body (aorta). The aortic valve opens when the left ventricle squeezes to pump blood into the aorta, and it closes when the left ventricle relaxes. In aortic valve regurgitation, which may also be called aortic insufficiency, blood in the aorta leaks through the aortic valve after it has closed. This causes the heart to work harder than usual. If aortic valve regurgitation is not treated, it causes enlargement and weakening of the left ventricle. This can result in heart failure, abnormal heart rhythms (arrhythmias), and other dangerous conditions. If this condition develops suddenly, it  may need to be treated with emergency surgery. What are the causes? This condition may be caused by anything that weakens the aortic valve, such as:  Severe high blood pressure (hypertension).  Infection of the inner lining of the heart or the heart valves (endocarditis).  A ballooning of a weak spot in the aorta wall (aortic aneurysm).  A tear or separation of the inner walls of the aorta (aortic dissection).  Injury (trauma) that damages the aortic valve.  Certain medicines.  Disease of a protein in the body called collagen (collagen vascular disease).  A heart problem (bicuspid aortic valve) that is present at birth (congenital).  An inflammatory condition  that can develop after an untreated strep throat infection (rheumatic fever).  Complications during or after a heart surgery. This is rare. What are the signs or symptoms? Symptoms of this condition include:  Fatigue.  Shortness of breath.  Difficulty breathing while lying flat (orthopnea). You may need to sleep on two or more pillows to breathe better.  Chest discomfort (angina).  Head bobbing.  A fluttering feeling in the chest (palpitations).  An irregular or faster-than-normal heartbeat. Symptoms usually develop gradually, unless this condition was caused by a major injury or by endocarditis. How is this diagnosed? This condition is diagnosed based on:  A physical exam.  An imaging test that uses sound waves to produce images of the heart (echocardiogram). You may also have other tests to confirm the diagnosis, including:  Chest X-ray.  MRI.  A test that records the electrical impulses of the heart (electrocardiogram, ECG).  CT angiogram (CTA). In this procedure, a large X-ray machine, called a CT scanner, takes detailed pictures of blood vessels after dye has been injected into the vessels.  Aortic angiogram. In this procedure, X-ray images are taken after dye has been injected into blood vessels. This tests the function of the aorta. How is this treated? Treatment depends on your symptoms, how severe the condition is, and what problems the condition is causing. Treatment may include:  Observation. If your condition is mild, you may not need treatment. However, you will need to have your condition checked regularly to make sure it is not getting worse or causing serious problems.  Medicines that help the heart work more efficiently.  Surgery to repair or replace the valve, in severe cases. Surgery is usually recommended if the left ventricle enlarges beyond a certain point. If aortic valve regurgitation occurs suddenly, surgery may be needed immediately. Follow these  instructions at home:  Take over-the-counter and prescription medicines only as told by your health care provider.  Do not use any products that contain nicotine or tobacco, such as cigarettes, e-cigarettes, and chewing tobacco. If you need help quitting, ask your health care provider.  If directed by your health care provider, avoid heavy weight lifting and contact sports such as football.  Follow instructions from your health care provider about eating or drinking restrictions. Your health care provider may recommend that you: ? Limit alcohol use to:  0-1 drink a day for women.  0-2 drinks a day for men. ? Be aware of how much alcohol is in your drink. In the [place] one drink equals one 12 oz bottle of beer (355 mL), one 5 oz glass of wine (148 mL), or one 1 oz glass of hard liquor (44 mL). ? Eat foods that are high in fiber, such as fresh fruits and vegetables, whole grains, and beans. ? Eat less salt (sodium) and salty foods.  Check ingredients and nutrition facts on packaged foods and beverages.  Keep all follow-up visits as told by your health care provider. This is important. You may need regular tests to monitor your condition and check how well your heart is pumping blood. Contact a health care provider if:  Your angina symptoms are more frequent or seem to be getting worse.  Your breathing problems seem to be getting worse.  You feel dizzy or close to fainting.  You have swelling in your feet, ankles, legs, or abdomen.  You urinate more than usual during the night (nocturia).  You have an unexplained fever that lasts 2 days or longer.  You develop new symptoms. Get help right away if you:  Have severe chest pain.  Have severe shortness of breath.  Feel rapid or irregular heartbeats.  Feel light-headed or you faint.  Have sudden, unexplained weight gain. Summary  Aortic valve regurgitation is a condition in which the aortic valve does not close all the way. This  causes the heart to work harder than usual.  This condition may be treated with observation, medicines, or surgery.  Take over-the-counter and prescription medicines only as told by your health care provider.  Eat less salt (sodium) and salty foods. Check ingredients and nutrition facts on packaged foods and beverages.  Get help right away if you have severe chest pain, shortness of breath, irregular heartbeats, sudden weight gain, or if you feel light-headed or you faint. This information is not intended to replace advice given to you by your health care provider. Make sure you discuss any questions you have with your health care provider. Document Revised: 12/15/2018 Document Reviewed: 12/01/2017 Elsevier Patient Education  [place].

## 2019-06-04 ENCOUNTER — Other Ambulatory Visit: Payer: Self-pay | Admitting: Family Medicine

## 2019-06-04 DIAGNOSIS — E785 Hyperlipidemia, unspecified: Secondary | ICD-10-CM

## 2019-06-04 NOTE — Telephone Encounter (Signed)
Requested Prescriptions  Pending Prescriptions Disp Refills  . atorvastatin (LIPITOR) 10 MG tablet [Pharmacy Med Name: ATORVASTATIN 10 MG TABLET] 90 tablet 0    Sig: TAKE 1 TABLET BY MOUTH EVERY DAY     Cardiovascular:  Antilipid - Statins Failed - 06/04/2019  8:33 AM      Failed - Total Cholesterol in normal range and within 360 days    Cholesterol, Total  Date Value Ref Range Status  04/13/2019 220 (H) 100 - 199 mg/dL Final         Failed - LDL in normal range and within 360 days    LDL Chol Calc (NIH)  Date Value Ref Range Status  04/13/2019 120 (H) 0 - 99 mg/dL Final         Failed - Triglycerides in normal range and within 360 days    Triglycerides  Date Value Ref Range Status  04/13/2019 285 (H) 0 - 149 mg/dL Final         Passed - HDL in normal range and within 360 days    HDL  Date Value Ref Range Status  04/13/2019 50 >39 mg/dL Final         Passed - Patient is not pregnant      Passed - Valid encounter within last 12 months    Recent Outpatient Visits          1 month ago Hyperlipidemia, unspecified hyperlipidemia type   [facility], Asencion Partridge, MD   3 months ago Annual physical exam   [facility], Asencion Partridge, MD   3 months ago Heart murmur   [facility], Asencion Partridge, MD   1 year ago Annual physical exam   [facility], Asencion Partridge, MD   2 years ago Hyperlipidemia, unspecified hyperlipidemia type   [facility], Asencion Partridge, MD      Future Appointments            In 1 month Neva Seat Asencion Partridge, MD [facility] [place], [facility]

## 2019-07-13 ENCOUNTER — Encounter: Payer: Self-pay | Admitting: Family Medicine

## 2019-07-13 ENCOUNTER — Ambulatory Visit: Payer: BC Managed Care – PPO | Admitting: Family Medicine

## 2019-07-13 ENCOUNTER — Other Ambulatory Visit: Payer: Self-pay

## 2019-07-13 VITALS — BP 136/80 | HR 89 | Temp 98.1°F | Ht 64.0 in | Wt 152.0 lb

## 2019-07-13 DIAGNOSIS — R7303 Prediabetes: Secondary | ICD-10-CM

## 2019-07-13 DIAGNOSIS — M722 Plantar fascial fibromatosis: Secondary | ICD-10-CM | POA: Diagnosis not present

## 2019-07-13 DIAGNOSIS — I10 Essential (primary) hypertension: Secondary | ICD-10-CM | POA: Diagnosis not present

## 2019-07-13 DIAGNOSIS — E785 Hyperlipidemia, unspecified: Secondary | ICD-10-CM

## 2019-07-13 DIAGNOSIS — M2142 Flat foot [pes planus] (acquired), left foot: Secondary | ICD-10-CM

## 2019-07-13 MED ORDER — HYDROCHLOROTHIAZIDE 12.5 MG PO CAPS: 12.5000 mg | ORAL_CAPSULE | Freq: Every day | ORAL | 1 refills | Status: DC

## 2019-07-13 MED ORDER — AMLODIPINE BESYLATE 10 MG PO TABS: 10.0000 mg | ORAL_TABLET | Freq: Every day | ORAL | 1 refills | Status: DC

## 2019-07-13 MED ORDER — ATORVASTATIN CALCIUM 10 MG PO TABS: 10.0000 mg | ORAL_TABLET | Freq: Every day | ORAL | 1 refills | Status: DC

## 2019-07-13 NOTE — Patient Instructions (Addendum)
No medication changes for now.  Foot pain appears to be plantar fasciitis.  Try the stretches as we discussed, arch insert may be helpful, ice massage, occasional ibuprofen for the next week or 2 if needed.  Recheck in 3 weeks, sooner if worse.  Continue to watch diet, exercise or weight management and prediabetes.  I will let you know if there are concerns on labs.  Plantar Fasciitis  Plantar fasciitis is a painful foot condition that affects the heel. It occurs when the band of tissue that connects the toes to the heel bone (plantar fascia) becomes irritated. This can happen as the result of exercising too much or doing other repetitive activities (overuse injury). The pain from plantar fasciitis can range from mild irritation to severe pain that makes it difficult to walk or move. The pain is usually worse in the morning after sleeping, or after sitting or lying down for a while. Pain may also be worse after long periods of walking or standing. What are the causes? This condition may be caused by:  Standing for long periods of time.  Wearing shoes that do not have good arch support.  Doing activities that put stress on joints (high-impact activities), including running, aerobics, and ballet.  Being overweight.  An abnormal way of walking (gait).  Tight muscles in the back of your lower leg (calf).  High arches in your feet.  Starting a new athletic activity. What are the signs or symptoms? The main symptom of this condition is heel pain. Pain may:  Be worse with first steps after a time of rest, especially in the morning after sleeping or after you have been sitting or lying down for a while.  Be worse after long periods of standing still.  Decrease after 30-45 minutes of activity, such as gentle walking. How is this diagnosed? This condition may be diagnosed based on your medical history and your symptoms. Your health care provider may ask questions about your activity level.  Your health care provider will do a physical exam to check for:  A tender area on the bottom of your foot.  A high arch in your foot.  Pain when you move your foot.  Difficulty moving your foot. You may have imaging tests to confirm the diagnosis, such as:  X-rays.  Ultrasound.  MRI. How is this treated? Treatment for plantar fasciitis depends on how severe your condition is. Treatment may include:  Rest, ice, applying pressure (compression), and raising the affected foot (elevation). This may be called RICE therapy. Your health care provider may recommend RICE therapy along with over-the-counter pain medicines to manage your pain.  Exercises to stretch your calves and your plantar fascia.  A splint that holds your foot in a stretched, upward position while you sleep (night splint).  Physical therapy to relieve symptoms and prevent problems in the future.  Injections of steroid medicine (cortisone) to relieve pain and inflammation.  Stimulating your plantar fascia with electrical impulses (extracorporeal shock wave therapy). This is usually the last treatment option before surgery.  Surgery, if other treatments have not worked after 12 months. Follow these instructions at home:  Managing pain, stiffness, and swelling  If directed, put ice on the painful area: ? Put ice in a plastic bag, or use a frozen bottle of water. ? Place a towel between your skin and the bag or bottle. ? Roll the bottom of your foot over the bag or bottle. ? Do this for 20 minutes, 2-3  times a day.  Wear athletic shoes that have air-sole or gel-sole cushions, or try wearing soft shoe inserts that are designed for plantar fasciitis.  Raise (elevate) your foot above the level of your heart while you are sitting or lying down. Activity  Avoid activities that cause pain. Ask your health care provider what activities are safe for you.  Do physical therapy exercises and stretches as told by your  health care provider.  Try activities and forms of exercise that are easier on your joints (low-impact). Examples include swimming, water aerobics, and biking. General instructions  Take over-the-counter and prescription medicines only as told by your health care provider.  Wear a night splint while sleeping, if told by your health care provider. Loosen the splint if your toes tingle, become numb, or turn cold and blue.  Maintain a healthy weight, or work with your health care provider to lose weight as needed.  Keep all follow-up visits as told by your health care provider. This is important. Contact a health care provider if you:  Have symptoms that do not go away after caring for yourself at home.  Have pain that gets worse.  Have pain that affects your ability to move or do your daily activities. Summary  Plantar fasciitis is a painful foot condition that affects the heel. It occurs when the band of tissue that connects the toes to the heel bone (plantar fascia) becomes irritated.  The main symptom of this condition is heel pain that may be worse after exercising too much or standing still for a long time.  Treatment varies, but it usually starts with rest, ice, compression, and elevation (RICE therapy) and over-the-counter medicines to manage pain. This information is not intended to replace advice given to you by your health care provider. Make sure you discuss any questions you have with your health care provider. Document Revised: 02/20/2017 Document Reviewed: 01/05/2017 Elsevier Patient Education  [employer].     If you have lab work done today you will be contacted with your lab results within the next 2 weeks.  If you have not heard from [place] then please contact us. The fastest way to get your results is to register for My Chart.   IF you received an x-ray today, you will receive an invoice from [facility]. Please contact [facility] at  [phone] with questions or concerns regarding your invoice.   IF you received labwork today, you will receive an invoice from [facility]. Please contact [facility] at [phone] with questions or concerns regarding your invoice.   Our billing staff will not be able to assist you with questions regarding bills from these companies.  You will be contacted with the lab results as soon as they are available. The fastest way to get your results is to activate your My Chart account. Instructions are located on the last page of this paperwork. If you have not heard from [place] regarding the results in 2 weeks, please contact this office.

## 2019-07-13 NOTE — Progress Notes (Signed)
Subjective:  Patient ID: John Obrien, male    DOB: 1975-05-19  Age: 44 y.o. MRN: [id number]  CC:  Chief Complaint  Patient presents with  . Follow-up    on hypertenstion and hyperlipidemia. pt stats he hasn't had any issue with either of these conditions at [facility] and that he has been taking his medication a prescribed with no side effects. pt dosn't check his BP at home,but dose check when ever he is at a pharmacy with a BP machine.    HPI John Obrien presents for  Hypertension: Amlodipine 10 mg daily, HCTZ 12.5 mg daily Home readings: 130-140/80-85 at pharmacy.  No new side effects with meds  BP Readings from Last 3 Encounters:  07/13/19 136/80  04/28/19 (!) 142/80  04/13/19 138/80   Lab Results  Component Value Date   CREATININE 0.91 04/13/2019   Hyperlipidemia: Lipitor 10 mg daily. Pain in left leg radiating to foot at times. No back pain. Past 2 weeks.  No bowel or bladder incontinence, no saddle anesthesia, no lower extremity weakness.  Improves with ibuprofen. Past 2 weeks. NKI, no new activities/exercise.  Initially on hip, now only in left foot. Ibuprofen 400mg  every 4 hrs few episodes per day - resolves pain.   Lab Results  Component Value Date   CHOL 220 (H) 04/13/2019   HDL 50 04/13/2019   LDLCALC 120 (H) 04/13/2019   TRIG 285 (H) 04/13/2019   CHOLHDL 4.4 04/13/2019   Lab Results  Component Value Date   ALT 14 04/13/2019   AST 30 04/13/2019   ALKPHOS 131 (H) 04/13/2019   BILITOT 0.2 04/13/2019   Hyperglycemia, prediabetes Glucose 118 in January. Eating healthier, air fryer.  Exercise - walking 3 days per week, situp/pushups.  Lab Results  Component Value Date   HGBA1C 6.3 (A) 02/10/2019   Wt Readings from Last 3 Encounters:  07/13/19 152 lb (68.9 kg)  04/28/19 149 lb (67.6 kg)  04/13/19 148 lb 6.4 oz (67.3 kg)    History Patient Active Problem List   Diagnosis Date Noted  . Murmur 02/28/2018  . Essential hypertension 02/28/2018    Past Medical History:  Diagnosis Date  . Hypertension    No past surgical history on file. No Known Allergies Prior to Admission medications   Medication Sig Start Date End Date Taking? Authorizing Provider  amLODipine (NORVASC) 10 MG tablet Take 1 tablet (10 mg total) by mouth daily. 03/02/19  Yes Wendie Agreste, MD  atorvastatin (LIPITOR) 10 MG tablet TAKE 1 TABLET BY MOUTH EVERY DAY 06/04/19  Yes Wendie Agreste, MD  hydrochlorothiazide (MICROZIDE) 12.5 MG capsule Take 1 capsule (12.5 mg total) by mouth daily. 04/13/19  Yes Wendie Agreste, MD   Social History   Socioeconomic History  . Marital status: Single    Spouse name: Not on file  . Number of children: Not on file  . Years of education: Not on file  . Highest education level: Not on file  Occupational History  . Not on file  Tobacco Use  . Smoking status: Never Smoker  . Smokeless tobacco: Never Used  Substance and Sexual Activity  . Alcohol use: Yes    Comment: sometimes   . Drug use: Never  . Sexual activity: Not on file  Other Topics Concern  . Not on file  Social History Narrative  . Not on file   Social Determinants of Health   Financial Resource Strain:   . Difficulty of Paying Living Expenses:  Food Insecurity:   . Worried About Programme researcher, broadcasting/film/video in the Last Year:   . Barista in the Last Year:   Transportation Needs:   . Freight forwarder (Medical):   Marland Kitchen Lack of Transportation (Non-Medical):   Physical Activity:   . Days of Exercise per Week:   . Minutes of Exercise per Session:   Stress:   . Feeling of Stress :   Social Connections:   . Frequency of Communication with Friends and Family:   . Frequency of Social Gatherings with Friends and Family:   . Attends Religious Services:   . Active Member of Clubs or Organizations:   . Attends Banker Meetings:   Marland Kitchen Marital Status:   Intimate Partner Violence:   . Fear of Current or Ex-Partner:   . Emotionally  Abused:   Marland Kitchen Physically Abused:   . Sexually Abused:     Review of Systems  Constitutional: Negative for fatigue and unexpected weight change.  Eyes: Negative for visual disturbance.  Respiratory: Negative for cough, chest tightness and shortness of breath.   Cardiovascular: Negative for chest pain, palpitations and leg swelling.  Gastrointestinal: Negative for abdominal pain and blood in stool.  Neurological: Negative for dizziness, light-headedness and headaches.     Objective:   Vitals:   07/13/19 0846 07/13/19 0850  BP: (!) 156/94 136/80  Pulse: 89   Temp: 98.1 F (36.7 C)   TempSrc: Temporal   SpO2: 98%   Weight: 152 lb (68.9 kg)   Height: 5\' 4"  (1.626 m)      Physical Exam Vitals reviewed.  Constitutional:      Appearance: He is well-developed.  HENT:     Head: Normocephalic and atraumatic.  Eyes:     Pupils: Pupils are equal, round, and reactive to light.  Neck:     Vascular: No carotid bruit or JVD.  Cardiovascular:     Rate and Rhythm: Normal rate and regular rhythm.     Heart sounds: Normal heart sounds. No murmur.  Pulmonary:     Effort: Pulmonary effort is normal.     Breath sounds: Normal breath sounds. No rales.  Musculoskeletal:     Comments: Left hip full pain-free range of motion, left knee pain with range of motion.  Calf, lower leg nontender, ankle nontender.  Minimal tenderness at the plantar fascia, mid aspect.  Metatarsals nontender, no other bony tenderness on the foot.  Negative seated straight leg raise.  Skin:    General: Skin is warm and dry.  Neurological:     Mental Status: He is alert and oriented to person, place, and time.        Assessment & Plan:  John Obrien is a 44 y.o. male . Prediabetes - Plan: Hemoglobin A1c  -Check A1c, diet/exercise continue to be discussed.  Commended on current exercise and diet approaches.  Essential hypertension - Plan: Lipid panel, Comprehensive metabolic panel, amLODipine (NORVASC) 10 MG  tablet, hydrochlorothiazide (MICROZIDE) 12.5 MG capsule  -Stable, continue same, labs pending  Hyperlipidemia, unspecified hyperlipidemia type - Plan: Lipid panel, Comprehensive metabolic panel, atorvastatin (LIPITOR) 10 MG tablet  -Tolerating Lipitor, denies new myalgias.  Foot pain only as below likely unrelated.  Continue same dose, labs pending  Plantar fasciitis Pes planus of left foot  -Handout given on treatment, ice massage, stretches, arch insert recommended.  Recheck 3 weeks.  Meds ordered this encounter  Medications  . atorvastatin (LIPITOR) 10 MG tablet  Sig: Take 1 tablet (10 mg total) by mouth daily.    Dispense:  90 tablet    Refill:  1  . amLODipine (NORVASC) 10 MG tablet    Sig: Take 1 tablet (10 mg total) by mouth daily.    Dispense:  90 tablet    Refill:  1  . hydrochlorothiazide (MICROZIDE) 12.5 MG capsule    Sig: Take 1 capsule (12.5 mg total) by mouth daily.    Dispense:  90 capsule    Refill:  1   Patient Instructions     No medication changes for now.  Foot pain appears to be plantar fasciitis.  Try the stretches as we discussed, arch insert may be helpful, ice massage, occasional ibuprofen for the next week or 2 if needed.  Recheck in 3 weeks, sooner if worse.  Continue to watch diet, exercise or weight management and prediabetes.  I will let you know if there are concerns on labs.  Plantar Fasciitis  Plantar fasciitis is a painful foot condition that affects the heel. It occurs when the band of tissue that connects the toes to the heel bone (plantar fascia) becomes irritated. This can happen as the result of exercising too much or doing other repetitive activities (overuse injury). The pain from plantar fasciitis can range from mild irritation to severe pain that makes it difficult to walk or move. The pain is usually worse in the morning after sleeping, or after sitting or lying down for a while. Pain may also be worse after long periods of walking or  standing. What are the causes? This condition may be caused by:  Standing for long periods of time.  Wearing shoes that do not have good arch support.  Doing activities that put stress on joints (high-impact activities), including running, aerobics, and ballet.  Being overweight.  An abnormal way of walking (gait).  Tight muscles in the back of your lower leg (calf).  High arches in your feet.  Starting a new athletic activity. What are the signs or symptoms? The main symptom of this condition is heel pain. Pain may:  Be worse with first steps after a time of rest, especially in the morning after sleeping or after you have been sitting or lying down for a while.  Be worse after long periods of standing still.  Decrease after 30-45 minutes of activity, such as gentle walking. How is this diagnosed? This condition may be diagnosed based on your medical history and your symptoms. Your health care provider may ask questions about your activity level. Your health care provider will do a physical exam to check for:  A tender area on the bottom of your foot.  A high arch in your foot.  Pain when you move your foot.  Difficulty moving your foot. You may have imaging tests to confirm the diagnosis, such as:  X-rays.  Ultrasound.  MRI. How is this treated? Treatment for plantar fasciitis depends on how severe your condition is. Treatment may include:  Rest, ice, applying pressure (compression), and raising the affected foot (elevation). This may be called RICE therapy. Your health care provider may recommend RICE therapy along with over-the-counter pain medicines to manage your pain.  Exercises to stretch your calves and your plantar fascia.  A splint that holds your foot in a stretched, upward position while you sleep (night splint).  Physical therapy to relieve symptoms and prevent problems in the future.  Injections of steroid medicine (cortisone) to relieve pain and  inflammation.  Stimulating your plantar fascia with electrical impulses (extracorporeal shock wave therapy). This is usually the last treatment option before surgery.  Surgery, if other treatments have not worked after 12 months. Follow these instructions at home:  Managing pain, stiffness, and swelling  If directed, put ice on the painful area: ? Put ice in a plastic bag, or use a frozen bottle of water. ? Place a towel between your skin and the bag or bottle. ? Roll the bottom of your foot over the bag or bottle. ? Do this for 20 minutes, 2-3 times a day.  Wear athletic shoes that have air-sole or gel-sole cushions, or try wearing soft shoe inserts that are designed for plantar fasciitis.  Raise (elevate) your foot above the level of your heart while you are sitting or lying down. Activity  Avoid activities that cause pain. Ask your health care provider what activities are safe for you.  Do physical therapy exercises and stretches as told by your health care provider.  Try activities and forms of exercise that are easier on your joints (low-impact). Examples include swimming, water aerobics, and biking. General instructions  Take over-the-counter and prescription medicines only as told by your health care provider.  Wear a night splint while sleeping, if told by your health care provider. Loosen the splint if your toes tingle, become numb, or turn cold and blue.  Maintain a healthy weight, or work with your health care provider to lose weight as needed.  Keep all follow-up visits as told by your health care provider. This is important. Contact a health care provider if you:  Have symptoms that do not go away after caring for yourself at home.  Have pain that gets worse.  Have pain that affects your ability to move or do your daily activities. Summary  Plantar fasciitis is a painful foot condition that affects the heel. It occurs when the band of tissue that connects the  toes to the heel bone (plantar fascia) becomes irritated.  The main symptom of this condition is heel pain that may be worse after exercising too much or standing still for a long time.  Treatment varies, but it usually starts with rest, ice, compression, and elevation (RICE therapy) and over-the-counter medicines to manage pain. This information is not intended to replace advice given to you by your health care provider. Make sure you discuss any questions you have with your health care provider. Document Revised: 02/20/2017 Document Reviewed: 01/05/2017 Elsevier Patient Education  [employer].     If you have lab work done today you will be contacted with your lab results within the next 2 weeks.  If you have not heard from [place] then please contact us. The fastest way to get your results is to register for My Chart.   IF you received an x-ray today, you will receive an invoice from [facility]. Please contact [facility] at [phone] with questions or concerns regarding your invoice.   IF you received labwork today, you will receive an invoice from [place]. Please contact [facility] at [phone] with questions or concerns regarding your invoice.   Our billing staff will not be able to assist you with questions regarding bills from these companies.  You will be contacted with the lab results as soon as they are available. The fastest way to get your results is to activate your My Chart account. Instructions are located on the last page of this paperwork. If you have not heard from [place] regarding  the results in 2 weeks, please contact this office.         Signed, Merri Ray, MD [facility]

## 2019-07-14 LAB — COMPREHENSIVE METABOLIC PANEL
ALT: 24 IU/L (ref 0–44)
AST: 38 IU/L (ref 0–40)
Albumin/Globulin Ratio: 1.4 (ref 1.2–2.2)
Albumin: 5.1 g/dL — ABNORMAL HIGH (ref 4.0–5.0)
Alkaline Phosphatase: 111 IU/L (ref 39–117)
BUN/Creatinine Ratio: 16 (ref 9–20)
BUN: 15 mg/dL (ref 6–24)
Bilirubin Total: <0.2 mg/dL (ref 0.0–1.2)
CO2: 23 mmol/L (ref 20–29)
Calcium: 10.3 mg/dL — ABNORMAL HIGH (ref 8.7–10.2)
Chloride: 99 mmol/L (ref 96–106)
Creatinine, Ser: 0.95 mg/dL (ref 0.76–1.27)
GFR calc Af Amer: 111 mL/min/{1.73_m2} (ref >59)
GFR calc non Af Amer: 96 mL/min/{1.73_m2} (ref >59)
Globulin, Total: 3.6 g/dL (ref 1.5–4.5)
Glucose: 152 mg/dL — ABNORMAL HIGH (ref 65–99)
Potassium: 4 mmol/L (ref 3.5–5.2)
Sodium: 140 mmol/L (ref 134–144)
Total Protein: 8.7 g/dL — ABNORMAL HIGH (ref 6.0–8.5)

## 2019-07-14 LAB — LIPID PANEL
Chol/HDL Ratio: 5.2 ratio — ABNORMAL HIGH (ref 0.0–5.0)
Cholesterol, Total: 212 mg/dL — ABNORMAL HIGH (ref 100–199)
HDL: 41 mg/dL (ref >39)
LDL Chol Calc (NIH): 109 mg/dL — ABNORMAL HIGH (ref 0–99)
Triglycerides: 361 mg/dL — ABNORMAL HIGH (ref 0–149)
VLDL Cholesterol Cal: 62 mg/dL — ABNORMAL HIGH (ref 5–40)

## 2019-07-14 LAB — HEMOGLOBIN A1C
Est. average glucose Bld gHb Est-mCnc: 140 mg/dL
Hgb A1c MFr Bld: 6.5 % — ABNORMAL HIGH (ref 4.8–5.6)

## 2019-08-10 ENCOUNTER — Ambulatory Visit: Payer: BC Managed Care – PPO | Admitting: Family Medicine

## 2019-08-26 ENCOUNTER — Other Ambulatory Visit: Payer: Self-pay

## 2019-08-26 ENCOUNTER — Encounter: Payer: Self-pay | Admitting: Family Medicine

## 2019-08-26 ENCOUNTER — Ambulatory Visit: Payer: BC Managed Care – PPO | Admitting: Family Medicine

## 2019-08-26 VITALS — BP 110/88 | HR 100 | Temp 97.8°F | Ht 64.0 in | Wt 146.0 lb

## 2019-08-26 DIAGNOSIS — E785 Hyperlipidemia, unspecified: Secondary | ICD-10-CM | POA: Diagnosis not present

## 2019-08-26 DIAGNOSIS — E1165 Type 2 diabetes mellitus with hyperglycemia: Secondary | ICD-10-CM | POA: Diagnosis not present

## 2019-08-26 DIAGNOSIS — M2142 Flat foot [pes planus] (acquired), left foot: Secondary | ICD-10-CM

## 2019-08-26 NOTE — Progress Notes (Signed)
Subjective:  Patient ID: John Obrien, male    DOB: June 28, 1975  Age: 44 y.o. MRN: [id number]  CC:  Chief Complaint  Patient presents with  . Follow-up    on L foot pain. pt reports no pain in that L foot for the past 2 weeks. pt feels completly recovered.    HPI Devontaye E Clutter presents for   Right foot pain  Last evaluated April 21.  Thought to be plantar fasciitis with pes planus of the left foot.  Stretches, arch insert recommended, ice massage recommended.  Stretches and walking - pain resolved after 2 weeks. No further need for ibuprofen. Wearing arch insert at work.   Diabetes: Previous prediabetes, A1c increased to 6.5 last visit. LDL elevated at 109. He is on Lipitor 10 mg daily, no meds for diabetes at this time.  Weight has decreased from April 21 visit by 7 pounds.  Walking more.  No home readings.  Denies increased thirst, blurry vision, or urinary frequency.  Wt Readings from Last 3 Encounters:  08/26/19 146 lb (66.2 kg)  07/13/19 152 lb (68.9 kg)  04/28/19 149 lb (67.6 kg)     Lab Results  Component Value Date   HGBA1C 6.5 (H) 07/13/2019   HGBA1C 6.3 (A) 02/10/2019   HGBA1C 6.0 (H) 01/05/2018   Lab Results  Component Value Date   LDLCALC 109 (H) 07/13/2019   CREATININE 0.95 07/13/2019     History Patient Active Problem List   Diagnosis Date Noted  . Murmur 02/28/2018  . Essential hypertension 02/28/2018   Past Medical History:  Diagnosis Date  . Hypertension    No past surgical history on file. No Known Allergies Prior to Admission medications   Medication Sig Start Date End Date Taking? Authorizing Provider  amLODipine (NORVASC) 10 MG tablet Take 1 tablet (10 mg total) by mouth daily. 07/13/19  Yes Wendie Agreste, MD  atorvastatin (LIPITOR) 10 MG tablet Take 1 tablet (10 mg total) by mouth daily. 07/13/19  Yes Wendie Agreste, MD  hydrochlorothiazide (MICROZIDE) 12.5 MG capsule Take 1 capsule (12.5 mg total) by mouth daily. 07/13/19   Yes Wendie Agreste, MD   Social History   Socioeconomic History  . Marital status: Single    Spouse name: Not on file  . Number of children: Not on file  . Years of education: Not on file  . Highest education level: Not on file  Occupational History  . Not on file  Tobacco Use  . Smoking status: Never Smoker  . Smokeless tobacco: Never Used  Substance and Sexual Activity  . Alcohol use: Yes    Comment: sometimes   . Drug use: Never  . Sexual activity: Not on file  Other Topics Concern  . Not on file  Social History Narrative  . Not on file   Social Determinants of Health   Financial Resource Strain:   . Difficulty of Paying Living Expenses:   Food Insecurity:   . Worried About Charity fundraiser in the Last Year:   . Arboriculturist in the Last Year:   Transportation Needs:   . Film/video editor (Medical):   Marland Kitchen Lack of Transportation (Non-Medical):   Physical Activity:   . Days of Exercise per Week:   . Minutes of Exercise per Session:   Stress:   . Feeling of Stress :   Social Connections:   . Frequency of Communication with Friends and Family:   . Frequency of  Social Gatherings with Friends and Family:   . Attends Religious Services:   . Active Member of Clubs or Organizations:   . Attends Banker Meetings:   Marland Kitchen Marital Status:   Intimate Partner Violence:   . Fear of Current or Ex-Partner:   . Emotionally Abused:   Marland Kitchen Physically Abused:   . Sexually Abused:     Review of Systems  Per HPI.  Objective:   Vitals:   08/26/19 1450  BP: 110/88  Pulse: 100  Temp: 97.8 F (36.6 C)  TempSrc: Temporal  SpO2: 99%  Weight: 146 lb (66.2 kg)  Height: 5\' 4"  (1.626 m)    Physical Exam Vitals reviewed.  Constitutional:      Appearance: He is well-developed.  HENT:     Head: Normocephalic and atraumatic.  Eyes:     Pupils: Pupils are equal, round, and reactive to light.  Neck:     Vascular: No carotid bruit or JVD.  Cardiovascular:      Rate and Rhythm: Normal rate and regular rhythm.     Heart sounds: Normal heart sounds. No murmur.  Pulmonary:     Effort: Pulmonary effort is normal.     Breath sounds: Normal breath sounds. No rales.  Skin:    General: Skin is warm and dry.  Neurological:     Mental Status: He is alert and oriented to person, place, and time.  Psychiatric:        Mood and Affect: Mood normal.         Assessment & Plan:  John Obrien is a 44 y.o. male . Type 2 diabetes mellitus with hyperglycemia, without long-term current use of insulin (HCC)  -Unfortunately barely at level of diabetes by last A1c but he has lost weight with positive changes in exercise.  Anticipate improvement with next A1c.  Denies hypoglycemia symptoms.  Follow-up in 2 months for repeat testing.  Hold on medications for now  Pes planus of left foot  -Previous pain resolved, continue arch insert.   Hyperlipidemia, unspecified hyperlipidemia type  -Slight elevation on last labs but again has improved exercise as above.  We will continue statin at same dose for now with repeat testing in 2 months.  Few other borderline labs on CMP can be repeated at that time as well.  No orders of the defined types were placed in this encounter.  Patient Instructions    Although blood sugar test was barely at diabetes level, I do not think you need to start any medication at this time as weight is decreasing.  Continue the good work with exercise and watching diet, recheck early August for repeat testing.  We can repeat your cholesterol levels that were slightly elevated at that time as well as some other electrolytes that were borderline.  Let me know if there are questions.    Continue shoe insert for foot pain but I am glad that has improved as well   If you have lab work done today you will be contacted with your lab results within the next 2 weeks.  If you have not heard from September then please contact us. The fastest way to get  your results is to register for My Chart.   IF you received an x-ray today, you will receive an invoice from [facility]. Please contact [facility] at [phone] with questions or concerns regarding your invoice.   IF you received labwork today, you will receive an invoice from [place]. Please contact  [facility] at [phone] with questions or concerns regarding your invoice.   Our billing staff will not be able to assist you with questions regarding bills from these companies.  You will be contacted with the lab results as soon as they are available. The fastest way to get your results is to activate your My Chart account. Instructions are located on the last page of this paperwork. If you have not heard from [place] regarding the results in 2 weeks, please contact this office.         Signed, Merri Ray, MD [facility]

## 2019-08-26 NOTE — Patient Instructions (Addendum)
  Although blood sugar test was barely at diabetes level, I do not think you need to start any medication at this time as weight is decreasing.  Continue the good work with exercise and watching diet, recheck early August for repeat testing.  We can repeat your cholesterol levels that were slightly elevated at that time as well as some other electrolytes that were borderline.  Let me know if there are questions.    Continue shoe insert for foot pain but I am glad that has improved as well   If you have lab work done today you will be contacted with your lab results within the next 2 weeks.  If you have not heard from [place] then please contact us. The fastest way to get your results is to register for My Chart.   IF you received an x-ray today, you will receive an invoice from [facility]. Please contact [facility] at [phone] with questions or concerns regarding your invoice.   IF you received labwork today, you will receive an invoice from [place]. Please contact [facility] at [phone] with questions or concerns regarding your invoice.   Our billing staff will not be able to assist you with questions regarding bills from these companies.  You will be contacted with the lab results as soon as they are available. The fastest way to get your results is to activate your My Chart account. Instructions are located on the last page of this paperwork. If you have not heard from [place] regarding the results in 2 weeks, please contact this office.

## 2019-09-13 ENCOUNTER — Encounter: Payer: Self-pay | Admitting: Family Medicine

## 2019-10-05 ENCOUNTER — Encounter: Payer: Self-pay | Admitting: Family Medicine

## 2019-10-31 ENCOUNTER — Other Ambulatory Visit: Payer: Self-pay | Admitting: Family Medicine

## 2019-10-31 DIAGNOSIS — I10 Essential (primary) hypertension: Secondary | ICD-10-CM

## 2019-11-03 ENCOUNTER — Ambulatory Visit: Payer: BC Managed Care – PPO | Admitting: Family Medicine

## 2019-11-03 ENCOUNTER — Other Ambulatory Visit: Payer: Self-pay

## 2019-11-03 ENCOUNTER — Encounter: Payer: Self-pay | Admitting: Family Medicine

## 2019-11-03 VITALS — BP 150/98 | HR 90 | Temp 98.5°F | Ht 64.0 in | Wt 148.0 lb

## 2019-11-03 DIAGNOSIS — E785 Hyperlipidemia, unspecified: Secondary | ICD-10-CM

## 2019-11-03 DIAGNOSIS — I10 Essential (primary) hypertension: Secondary | ICD-10-CM | POA: Diagnosis not present

## 2019-11-03 DIAGNOSIS — E1165 Type 2 diabetes mellitus with hyperglycemia: Secondary | ICD-10-CM

## 2019-11-03 LAB — HEMOGLOBIN A1C
Est. average glucose Bld gHb Est-mCnc: 140 mg/dL
Hgb A1c MFr Bld: 6.5 % — ABNORMAL HIGH (ref 4.8–5.6)

## 2019-11-03 MED ORDER — AMLODIPINE BESYLATE 10 MG PO TABS: 10.0000 mg | ORAL_TABLET | Freq: Every day | ORAL | 1 refills | Status: DC

## 2019-11-03 MED ORDER — HYDROCHLOROTHIAZIDE 25 MG PO TABS: 25.0000 mg | ORAL_TABLET | Freq: Every day | ORAL | 1 refills | Status: DC

## 2019-11-03 MED ORDER — ATORVASTATIN CALCIUM 10 MG PO TABS: 10.0000 mg | ORAL_TABLET | Freq: Every day | ORAL | 1 refills | Status: DC

## 2019-11-03 NOTE — Patient Instructions (Addendum)
   Increase hydrochlorothiazide to 25 mg each day for better blood pressure control.  If any lightheadedness, dizziness or new side effects on medication, return to previous dose and let me know.  Recheck in 3 months.  I will check diabetes test today and urine test for protein.  Let me know if there are questions.  Keep up the good work with exercise, goal of 150 minutes/week spread out over most days per week.  If you have lab work done today you will be contacted with your lab results within the next 2 weeks.  If you have not heard from [place] then please contact us. The fastest way to get your results is to register for My Chart.   IF you received an x-ray today, you will receive an invoice from [facility]. Please contact [facility] at [phone] with questions or concerns regarding your invoice.   IF you received labwork today, you will receive an invoice from [place]. Please contact [facility] at [phone] with questions or concerns regarding your invoice.   Our billing staff will not be able to assist you with questions regarding bills from these companies.  You will be contacted with the lab results as soon as they are available. The fastest way to get your results is to activate your My Chart account. Instructions are located on the last page of this paperwork. If you have not heard from [place] regarding the results in 2 weeks, please contact this office.

## 2019-11-03 NOTE — Progress Notes (Signed)
Subjective:  Patient ID: John Obrien, male    DOB: 13-Nov-1975  Age: 44 y.o. MRN: [id number]  CC:  Chief Complaint  Patient presents with  . Follow-up    on diabetes. Pt reports no issues with this condition. pt checks his BS every now and then at his local [employer]. Pt reports he has continued to diet and exercise per providers instructions.    HPI John Obrien presents for  Diabetes: Complicated by prior hypoglycemia.  Last office visit June 4.  Prediabetes previously with A1c 6.5 in April.  Diet control. Exercising 30 minutes 3 days/week, now with a bicycle, previously weightlifting and home exercises. He is on statin. Microalbumin: Normal ratio in 2019  Wt Readings from Last 3 Encounters:  11/03/19 148 lb (67.1 kg)  08/26/19 146 lb (66.2 kg)  07/13/19 152 lb (68.9 kg)     Lab Results  Component Value Date   HGBA1C 6.5 (H) 07/13/2019   HGBA1C 6.3 (A) 02/10/2019   HGBA1C 6.0 (H) 01/05/2018   Lab Results  Component Value Date   LDLCALC 109 (H) 07/13/2019   CREATININE 0.95 07/13/2019    Hypertension: Amlodipine 10 mg daily, HCTZ 12.5 mg daily, no side effects, no missed doses. Home readings: At [employer], 140/89. BP Readings from Last 3 Encounters:  11/03/19 (!) 150/98  08/26/19 110/88  07/13/19 136/80   Lab Results  Component Value Date   CREATININE 0.95 07/13/2019      History Patient Active Problem List   Diagnosis Date Noted  . Murmur 02/28/2018  . Essential hypertension 02/28/2018   Past Medical History:  Diagnosis Date  . Hypertension    No past surgical history on file. No Known Allergies Prior to Admission medications   Medication Sig Start Date End Date Taking? Authorizing Provider  amLODipine (NORVASC) 10 MG tablet Take 1 tablet (10 mg total) by mouth daily. 07/13/19  Yes Shade Flood, MD  atorvastatin (LIPITOR) 10 MG tablet Take 1 tablet (10 mg total) by mouth daily. 07/13/19  Yes Shade Flood, MD  hydrochlorothiazide  (MICROZIDE) 12.5 MG capsule TAKE 1 CAPSULE BY MOUTH DAILY 10/31/19  Yes Shade Flood, MD   Social History   Socioeconomic History  . Marital status: Single    Spouse name: Not on file  . Number of children: Not on file  . Years of education: Not on file  . Highest education level: Not on file  Occupational History  . Not on file  Tobacco Use  . Smoking status: Never Smoker  . Smokeless tobacco: Never Used  Vaping Use  . Vaping Use: Never used  Substance and Sexual Activity  . Alcohol use: Yes    Comment: sometimes   . Drug use: Never  . Sexual activity: Not on file  Other Topics Concern  . Not on file  Social History Narrative  . Not on file   Social Determinants of Health   Financial Resource Strain:   . Difficulty of Paying Living Expenses:   Food Insecurity:   . Worried About Programme researcher, broadcasting/film/video in the Last Year:   . Barista in the Last Year:   Transportation Needs:   . Freight forwarder (Medical):   Marland Kitchen Lack of Transportation (Non-Medical):   Physical Activity:   . Days of Exercise per Week:   . Minutes of Exercise per Session:   Stress:   . Feeling of Stress :   Social Connections:   . Frequency of  Communication with Friends and Family:   . Frequency of Social Gatherings with Friends and Family:   . Attends Religious Services:   . Active Member of Clubs or Organizations:   . Attends Banker Meetings:   Marland Kitchen Marital Status:   Intimate Partner Violence:   . Fear of Current or Ex-Partner:   . Emotionally Abused:   Marland Kitchen Physically Abused:   . Sexually Abused:     Review of Systems  Constitutional: Negative for fatigue and unexpected weight change.  Eyes: Negative for visual disturbance.  Respiratory: Negative for cough, chest tightness and shortness of breath.   Cardiovascular: Negative for chest pain, palpitations and leg swelling.  Gastrointestinal: Negative for abdominal pain and blood in stool.  Neurological: Negative for  dizziness, light-headedness and headaches.     Objective:   Vitals:   11/03/19 0930 11/03/19 0935  BP: (!) 159/98 (!) 150/98  Pulse: 90   Temp: 98.5 F (36.9 C)   TempSrc: Temporal   SpO2: 100%   Weight: 148 lb (67.1 kg)   Height: 5\' 4"  (1.626 m)      Physical Exam Vitals reviewed.  Constitutional:      Appearance: He is well-developed.  HENT:     Head: Normocephalic and atraumatic.  Eyes:     Pupils: Pupils are equal, round, and reactive to light.  Neck:     Vascular: No carotid bruit or JVD.  Cardiovascular:     Rate and Rhythm: Normal rate and regular rhythm.     Heart sounds: Normal heart sounds. No murmur heard.   Pulmonary:     Effort: Pulmonary effort is normal.     Breath sounds: Normal breath sounds. No rales.  Skin:    General: Skin is warm and dry.  Neurological:     Mental Status: He is alert and oriented to person, place, and time.        Assessment & Plan:  John Obrien is a 44 y.o. male . Type 2 diabetes mellitus with hyperglycemia, without long-term current use of insulin (HCC) - Plan: Hemoglobin A1c  -Check A1c, microalbumin, no changes for now.  Commended on exercise.  Essential hypertension - Plan: amLODipine (NORVASC) 10 MG tablet  -Continue amlodipine same dose, increase HCTZ to 25 mg daily.  Hyperlipidemia, unspecified hyperlipidemia type - Plan: atorvastatin (LIPITOR) 10 MG tablet  -Stable, continue same.  Anticipate improvement with increased exercise.  Meds ordered this encounter  Medications  . amLODipine (NORVASC) 10 MG tablet    Sig: Take 1 tablet (10 mg total) by mouth daily.    Dispense:  90 tablet    Refill:  1  . atorvastatin (LIPITOR) 10 MG tablet    Sig: Take 1 tablet (10 mg total) by mouth daily.    Dispense:  90 tablet    Refill:  1  . hydrochlorothiazide (HYDRODIURIL) 25 MG tablet    Sig: Take 1 tablet (25 mg total) by mouth daily.    Dispense:  90 tablet    Refill:  1   Patient Instructions      Increase hydrochlorothiazide to 25 mg each day for better blood pressure control.  If any lightheadedness, dizziness or new side effects on medication, return to previous dose and let me know.  Recheck in 3 months.  I will check diabetes test today and urine test for protein.  Let me know if there are questions.  Keep up the good work with exercise, goal of 150 minutes/week spread out over  most days per week.  If you have lab work done today you will be contacted with your lab results within the next 2 weeks.  If you have not heard from [place] then please contact us. The fastest way to get your results is to register for My Chart.   IF you received an x-ray today, you will receive an invoice from [facility]. Please contact [facility] at [phone] with questions or concerns regarding your invoice.   IF you received labwork today, you will receive an invoice from [place]. Please contact [facility] at [phone] with questions or concerns regarding your invoice.   Our billing staff will not be able to assist you with questions regarding bills from these companies.  You will be contacted with the lab results as soon as they are available. The fastest way to get your results is to activate your My Chart account. Instructions are located on the last page of this paperwork. If you have not heard from [place] regarding the results in 2 weeks, please contact this office.         Signed, Meredith Staggers, MD [facility]

## 2019-11-04 LAB — MICROALBUMIN / CREATININE URINE RATIO
Creatinine, Urine: 201 mg/dL
Microalb/Creat Ratio: 6 mg/g creat (ref 0–29)
Microalbumin, Urine: 11.7 ug/mL

## 2020-02-09 ENCOUNTER — Other Ambulatory Visit: Payer: Self-pay

## 2020-02-09 ENCOUNTER — Encounter: Payer: Self-pay | Admitting: Family Medicine

## 2020-02-09 ENCOUNTER — Ambulatory Visit: Payer: BC Managed Care – PPO | Admitting: Family Medicine

## 2020-02-09 VITALS — BP 140/90 | HR 95 | Temp 98.1°F | Ht 64.0 in | Wt 148.4 lb

## 2020-02-09 DIAGNOSIS — E785 Hyperlipidemia, unspecified: Secondary | ICD-10-CM | POA: Diagnosis not present

## 2020-02-09 DIAGNOSIS — Z23 Encounter for immunization: Secondary | ICD-10-CM

## 2020-02-09 DIAGNOSIS — I10 Essential (primary) hypertension: Secondary | ICD-10-CM

## 2020-02-09 DIAGNOSIS — E1165 Type 2 diabetes mellitus with hyperglycemia: Secondary | ICD-10-CM

## 2020-02-09 LAB — LIPID PANEL

## 2020-02-09 MED ORDER — ATORVASTATIN CALCIUM 10 MG PO TABS: 10.0000 mg | ORAL_TABLET | Freq: Every day | ORAL | 1 refills | Status: DC

## 2020-02-09 MED ORDER — HYDROCHLOROTHIAZIDE 25 MG PO TABS: 25.0000 mg | ORAL_TABLET | Freq: Every day | ORAL | 1 refills | Status: DC

## 2020-02-09 MED ORDER — AMLODIPINE BESYLATE 10 MG PO TABS: 10.0000 mg | ORAL_TABLET | Freq: Every day | ORAL | 1 refills | Status: DC

## 2020-02-09 NOTE — Patient Instructions (Addendum)
Blood pressure borderline high. Keep a record of your blood pressures outside of the office and send me those readings in next few weeks to decide if additional med needed.   No med changes today.   Managing Your Hypertension Hypertension is commonly called high blood pressure. This is when the force of your blood pressing against the walls of your arteries is too strong. Arteries are blood vessels that carry blood from your heart throughout your body. Hypertension forces the heart to work harder to pump blood, and may cause the arteries to become narrow or stiff. Having untreated or uncontrolled hypertension can cause heart attack, stroke, kidney disease, and other problems. What are blood pressure readings? A blood pressure reading consists of a higher number over a lower number. Ideally, your blood pressure should be below 120/80. The first ("top") number is called the systolic pressure. It is a measure of the pressure in your arteries as your heart beats. The second ("bottom") number is called the diastolic pressure. It is a measure of the pressure in your arteries as the heart relaxes. What does my blood pressure reading mean? Blood pressure is classified into four stages. Based on your blood pressure reading, your health care provider may use the following stages to determine what type of treatment you need, if any. Systolic pressure and diastolic pressure are measured in a unit called mm Hg. Normal  Systolic pressure: below 120.  Diastolic pressure: below 80. Elevated  Systolic pressure: 120-129.  Diastolic pressure: below 80. Hypertension stage 1  Systolic pressure: 130-139.  Diastolic pressure: 80-89. Hypertension stage 2  Systolic pressure: 140 or above.  Diastolic pressure: 90 or above. What health risks are associated with hypertension? Managing your hypertension is an important responsibility. Uncontrolled hypertension can lead to:  A heart attack.  A stroke.  A  weakened blood vessel (aneurysm).  Heart failure.  Kidney damage.  Eye damage.  Metabolic syndrome.  Memory and concentration problems. What changes can I make to manage my hypertension? Hypertension can be managed by making lifestyle changes and possibly by taking medicines. Your health care provider will help you make a plan to bring your blood pressure within a normal range. Eating and drinking   Eat a diet that is high in fiber and potassium, and low in salt (sodium), added sugar, and fat. An example eating plan is called the DASH (Dietary Approaches to Stop Hypertension) diet. To eat this way: ? Eat plenty of fresh fruits and vegetables. Try to fill half of your plate at each meal with fruits and vegetables. ? Eat whole grains, such as whole wheat pasta, brown rice, or whole grain bread. Fill about one quarter of your plate with whole grains. ? Eat low-fat diary products. ? Avoid fatty cuts of meat, processed or cured meats, and poultry with skin. Fill about one quarter of your plate with lean proteins such as fish, chicken without skin, beans, eggs, and tofu. ? Avoid premade and processed foods. These tend to be higher in sodium, added sugar, and fat.  Reduce your daily sodium intake. Most people with hypertension should eat less than 1,500 mg of sodium a day.  Limit alcohol intake to no more than 1 drink a day for nonpregnant women and 2 drinks a day for men. One drink equals 12 oz of beer, 5 oz of wine, or 1 oz of hard liquor. Lifestyle  Work with your health care provider to maintain a healthy body weight, or to lose weight. Ask what  an ideal weight is for you.  Get at least 30 minutes of exercise that causes your heart to beat faster (aerobic exercise) most days of the week. Activities may include walking, swimming, or biking.  Include exercise to strengthen your muscles (resistance exercise), such as weight lifting, as part of your weekly exercise routine. Try to do these  types of exercises for 30 minutes at least 3 days a week.  Do not use any products that contain nicotine or tobacco, such as cigarettes and e-cigarettes. If you need help quitting, ask your health care provider.  Control any long-term (chronic) conditions you have, such as high cholesterol or diabetes. Monitoring  Monitor your blood pressure at home as told by your health care provider. Your personal target blood pressure may vary depending on your medical conditions, your age, and other factors.  Have your blood pressure checked regularly, as often as told by your health care provider. Working with your health care provider  Review all the medicines you take with your health care provider because there may be side effects or interactions.  Talk with your health care provider about your diet, exercise habits, and other lifestyle factors that may be contributing to hypertension.  Visit your health care provider regularly. Your health care provider can help you create and adjust your plan for managing hypertension. Will I need medicine to control my blood pressure? Your health care provider may prescribe medicine if lifestyle changes are not enough to get your blood pressure under control, and if:  Your systolic blood pressure is 130 or higher.  Your diastolic blood pressure is 80 or higher. Take medicines only as told by your health care provider. Follow the directions carefully. Blood pressure medicines must be taken as prescribed. The medicine does not work as well when you skip doses. Skipping doses also puts you at risk for problems. Contact a health care provider if:  You think you are having a reaction to medicines you have taken.  You have repeated (recurrent) headaches.  You feel dizzy.  You have swelling in your ankles.  You have trouble with your vision. Get help right away if:  You develop a severe headache or confusion.  You have unusual weakness or numbness, or you  feel faint.  You have severe pain in your chest or abdomen.  You vomit repeatedly.  You have trouble breathing. Summary  Hypertension is when the force of blood pumping through your arteries is too strong. If this condition is not controlled, it may put you at risk for serious complications.  Your personal target blood pressure may vary depending on your medical conditions, your age, and other factors. For most people, a normal blood pressure is less than 120/80.  Hypertension is managed by lifestyle changes, medicines, or both. Lifestyle changes include weight loss, eating a healthy, low-sodium diet, exercising more, and limiting alcohol. This information is not intended to replace advice given to you by your health care provider. Make sure you discuss any questions you have with your health care provider. Document Revised: 07/02/2018 Document Reviewed: 02/06/2016 Elsevier Patient Education  [employer].     If you have lab work done today you will be contacted with your lab results within the next 2 weeks.  If you have not heard from [place] then please contact us. The fastest way to get your results is to register for My Chart.   IF you received an x-ray today, you will receive an invoice from [facility].  Please contact [facility] at [phone] with questions or concerns regarding your invoice.   IF you received labwork today, you will receive an invoice from [place]. Please contact [facility] at [phone] with questions or concerns regarding your invoice.   Our billing staff will not be able to assist you with questions regarding bills from these companies.  You will be contacted with the lab results as soon as they are available. The fastest way to get your results is to activate your My Chart account. Instructions are located on the last page of this paperwork. If you have not heard from [place] regarding the results in 2 weeks, please contact this office.

## 2020-02-09 NOTE — Progress Notes (Signed)
Subjective:  Patient ID: John Obrien, male    DOB: [birth year]  Age: 44 y.o. MRN: [id number]  CC:  Chief Complaint  Patient presents with  . Hypertension  . Diabetes    HPI John Obrien presents for   Diabetes: Diet/exercise control.  Previous prediabetes it had shifted to diabetes based on A1c of 6.5.  He is on statin. Exercise - 3 days per week.  Sweet tea/soda - rare. Rare fast food.  Microalbumin: Normal ratio 11/03/2019 Wt Readings from Last 3 Encounters:  02/09/20 148 lb 6.4 oz (67.3 kg)  11/03/19 148 lb (67.1 kg)  08/26/19 146 lb (66.2 kg)    Lab Results  Component Value Date   HGBA1C 6.5 (H) 11/03/2019   HGBA1C 6.5 (H) 07/13/2019   HGBA1C 6.3 (A) 02/10/2019   Lab Results  Component Value Date   LDLCALC 109 (H) 07/13/2019   CREATININE 0.95 07/13/2019   Hypertension: Amlodipine 10 mg, HCTZ 25 mg both daily. No missed doses of meds.  Home readings:none.  No added salt. Cooks at home.  BP Readings from Last 3 Encounters:  02/09/20 140/90  11/03/19 (!) 150/98  08/26/19 110/88   Lab Results  Component Value Date   CREATININE 0.95 07/13/2019   Hyperlipidemia: Lipitor 10 mg daily.  Fasting today.  Lab Results  Component Value Date   CHOL 212 (H) 07/13/2019   HDL 41 07/13/2019   LDLCALC 109 (H) 07/13/2019   TRIG 361 (H) 07/13/2019   CHOLHDL 5.2 (H) 07/13/2019   Lab Results  Component Value Date   ALT 24 07/13/2019   AST 38 07/13/2019   ALKPHOS 111 07/13/2019   BILITOT <0.2 07/13/2019    The natural history of prostate cancer and ongoing controversy regarding screening and potential treatment outcomes of prostate cancer has been discussed with the patient. The meaning of a false positive PSA and a false negative PSA has been discussed. He indicates understanding of the limitations of this screening test and wishes NOT to proceed with screening PSA testing.     History Patient Active Problem List   Diagnosis Date Noted  . Murmur  02/28/2018  . Essential hypertension 02/28/2018   Past Medical History:  Diagnosis Date  . Hypertension    No past surgical history on file. No Known Allergies Prior to Admission medications   Medication Sig Start Date End Date Taking? Authorizing Provider  amLODipine (NORVASC) 10 MG tablet Take 1 tablet (10 mg total) by mouth daily. 11/03/19   Wendie Agreste, MD  atorvastatin (LIPITOR) 10 MG tablet Take 1 tablet (10 mg total) by mouth daily. 11/03/19   Wendie Agreste, MD  hydrochlorothiazide (HYDRODIURIL) 25 MG tablet Take 1 tablet (25 mg total) by mouth daily. 11/03/19   Wendie Agreste, MD   Social History   Socioeconomic History  . Marital status: Single    Spouse name: Not on file  . Number of children: Not on file  . Years of education: Not on file  . Highest education level: Not on file  Occupational History  . Not on file  Tobacco Use  . Smoking status: Never Smoker  . Smokeless tobacco: Never Used  Vaping Use  . Vaping Use: Never used  Substance and Sexual Activity  . Alcohol use: Yes    Comment: sometimes   . Drug use: Never  . Sexual activity: Not on file  Other Topics Concern  . Not on file  Social History Narrative  . Not on file  Social Determinants of Health   Financial Resource Strain:   . Difficulty of Paying Living Expenses: Not on file  Food Insecurity:   . Worried About Charity fundraiser in the Last Year: Not on file  . Ran Out of Food in the Last Year: Not on file  Transportation Needs:   . Lack of Transportation (Medical): Not on file  . Lack of Transportation (Non-Medical): Not on file  Physical Activity:   . Days of Exercise per Week: Not on file  . Minutes of Exercise per Session: Not on file  Stress:   . Feeling of Stress : Not on file  Social Connections:   . Frequency of Communication with Friends and Family: Not on file  . Frequency of Social Gatherings with Friends and Family: Not on file  . Attends Religious Services:  Not on file  . Active Member of Clubs or Organizations: Not on file  . Attends Archivist Meetings: Not on file  . Marital Status: Not on file  Intimate Partner Violence:   . Fear of Current or Ex-Partner: Not on file  . Emotionally Abused: Not on file  . Physically Abused: Not on file  . Sexually Abused: Not on file    Review of Systems  Constitutional: Negative for fatigue and unexpected weight change.  Eyes: Negative for visual disturbance.  Respiratory: Negative for cough, chest tightness and shortness of breath.   Cardiovascular: Negative for chest pain, palpitations and leg swelling.  Gastrointestinal: Negative for abdominal pain and blood in stool.  Neurological: Negative for dizziness, light-headedness and headaches.     Objective:   Vitals:   02/09/20 0848  BP: 140/90  Pulse: 95  Temp: 98.1 F (36.7 C)  SpO2: 100%  Weight: 148 lb 6.4 oz (67.3 kg)  Height: '5\' 4"'  (1.626 m)     Physical Exam Vitals reviewed.  Constitutional:      Appearance: He is well-developed.  HENT:     Head: Normocephalic and atraumatic.  Eyes:     Pupils: Pupils are equal, round, and reactive to light.  Neck:     Vascular: No carotid bruit or JVD.  Cardiovascular:     Rate and Rhythm: Normal rate and regular rhythm.     Heart sounds: Normal heart sounds. No murmur heard.   Pulmonary:     Effort: Pulmonary effort is normal.     Breath sounds: Normal breath sounds. No rales.  Skin:    General: Skin is warm and dry.  Neurological:     Mental Status: He is alert and oriented to person, place, and time.        Assessment & Plan:  John Obrien is a 44 y.o. male . Type 2 diabetes mellitus with hyperglycemia, without long-term current use of insulin (HCC) - Plan: Lipid panel, CMP14+EGFR  - continue diet/exerise approach. Check A1c, consider metformin if increasing.   Essential hypertension - Plan: Lipid panel, CMP14+EGFR  -Borderline elevated, ideally should be  under 130/80.  Check home readings with report in the next few weeks.  No med changes for now.  Hyperlipidemia -tolerating Lipitor, continue same.  Labs pending  Need for prophylactic vaccination and inoculation against influenza  Meds ordered this encounter  Medications  . amLODipine (NORVASC) 10 MG tablet    Sig: Take 1 tablet (10 mg total) by mouth daily.    Dispense:  90 tablet    Refill:  1  . atorvastatin (LIPITOR) 10 MG tablet  Sig: Take 1 tablet (10 mg total) by mouth daily.    Dispense:  90 tablet    Refill:  1  . hydrochlorothiazide (HYDRODIURIL) 25 MG tablet    Sig: Take 1 tablet (25 mg total) by mouth daily.    Dispense:  90 tablet    Refill:  1   Patient Instructions    Blood pressure borderline high. Keep a record of your blood pressures outside of the office and send me those readings in next few weeks to decide if additional med needed.   No med changes today.   Managing Your Hypertension Hypertension is commonly called high blood pressure. This is when the force of your blood pressing against the walls of your arteries is too strong. Arteries are blood vessels that carry blood from your heart throughout your body. Hypertension forces the heart to work harder to pump blood, and may cause the arteries to become narrow or stiff. Having untreated or uncontrolled hypertension can cause heart attack, stroke, kidney disease, and other problems. What are blood pressure readings? A blood pressure reading consists of a higher number over a lower number. Ideally, your blood pressure should be below 120/80. The first ("top") number is called the systolic pressure. It is a measure of the pressure in your arteries as your heart beats. The second ("bottom") number is called the diastolic pressure. It is a measure of the pressure in your arteries as the heart relaxes. What does my blood pressure reading mean? Blood pressure is classified into four stages. Based on your blood  pressure reading, your health care provider may use the following stages to determine what type of treatment you need, if any. Systolic pressure and diastolic pressure are measured in a unit called mm Hg. Normal  Systolic pressure: below 465.  Diastolic pressure: below 80. Elevated  Systolic pressure: 681-275.  Diastolic pressure: below 80. Hypertension stage 1  Systolic pressure: 170-017.  Diastolic pressure: 49-44. Hypertension stage 2  Systolic pressure: 967 or above.  Diastolic pressure: 90 or above. What health risks are associated with hypertension? Managing your hypertension is an important responsibility. Uncontrolled hypertension can lead to:  A heart attack.  A stroke.  A weakened blood vessel (aneurysm).  Heart failure.  Kidney damage.  Eye damage.  Metabolic syndrome.  Memory and concentration problems. What changes can I make to manage my hypertension? Hypertension can be managed by making lifestyle changes and possibly by taking medicines. Your health care provider will help you make a plan to bring your blood pressure within a normal range. Eating and drinking   Eat a diet that is high in fiber and potassium, and low in salt (sodium), added sugar, and fat. An example eating plan is called the DASH (Dietary Approaches to Stop Hypertension) diet. To eat this way: ? Eat plenty of fresh fruits and vegetables. Try to fill half of your plate at each meal with fruits and vegetables. ? Eat whole grains, such as whole wheat pasta, brown rice, or whole grain bread. Fill about one quarter of your plate with whole grains. ? Eat low-fat diary products. ? Avoid fatty cuts of meat, processed or cured meats, and poultry with skin. Fill about one quarter of your plate with lean proteins such as fish, chicken without skin, beans, eggs, and tofu. ? Avoid premade and processed foods. These tend to be higher in sodium, added sugar, and fat.  Reduce your daily sodium  intake. Most people with hypertension should eat less than 1,500  mg of sodium a day.  Limit alcohol intake to no more than 1 drink a day for nonpregnant women and 2 drinks a day for men. One drink equals 12 oz of beer, 5 oz of wine, or 1 oz of hard liquor. Lifestyle  Work with your health care provider to maintain a healthy body weight, or to lose weight. Ask what an ideal weight is for you.  Get at least 30 minutes of exercise that causes your heart to beat faster (aerobic exercise) most days of the week. Activities may include walking, swimming, or biking.  Include exercise to strengthen your muscles (resistance exercise), such as weight lifting, as part of your weekly exercise routine. Try to do these types of exercises for 30 minutes at least 3 days a week.  Do not use any products that contain nicotine or tobacco, such as cigarettes and e-cigarettes. If you need help quitting, ask your health care provider.  Control any long-term (chronic) conditions you have, such as high cholesterol or diabetes. Monitoring  Monitor your blood pressure at home as told by your health care provider. Your personal target blood pressure may vary depending on your medical conditions, your age, and other factors.  Have your blood pressure checked regularly, as often as told by your health care provider. Working with your health care provider  Review all the medicines you take with your health care provider because there may be side effects or interactions.  Talk with your health care provider about your diet, exercise habits, and other lifestyle factors that may be contributing to hypertension.  Visit your health care provider regularly. Your health care provider can help you create and adjust your plan for managing hypertension. Will I need medicine to control my blood pressure? Your health care provider may prescribe medicine if lifestyle changes are not enough to get your blood pressure under control,  and if:  Your systolic blood pressure is 130 or higher.  Your diastolic blood pressure is 80 or higher. Take medicines only as told by your health care provider. Follow the directions carefully. Blood pressure medicines must be taken as prescribed. The medicine does not work as well when you skip doses. Skipping doses also puts you at risk for problems. Contact a health care provider if:  You think you are having a reaction to medicines you have taken.  You have repeated (recurrent) headaches.  You feel dizzy.  You have swelling in your ankles.  You have trouble with your vision. Get help right away if:  You develop a severe headache or confusion.  You have unusual weakness or numbness, or you feel faint.  You have severe pain in your chest or abdomen.  You vomit repeatedly.  You have trouble breathing. Summary  Hypertension is when the force of blood pumping through your arteries is too strong. If this condition is not controlled, it may put you at risk for serious complications.  Your personal target blood pressure may vary depending on your medical conditions, your age, and other factors. For most people, a normal blood pressure is less than 120/80.  Hypertension is managed by lifestyle changes, medicines, or both. Lifestyle changes include weight loss, eating a healthy, low-sodium diet, exercising more, and limiting alcohol. This information is not intended to replace advice given to you by your health care provider. Make sure you discuss any questions you have with your health care provider. Document Revised: 07/02/2018 Document Reviewed: 02/06/2016 Elsevier Patient Education  [place].  If you have lab work done today you will be contacted with your lab results within the next 2 weeks.  If you have not heard from [place] then please contact us. The fastest way to get your results is to register for My Chart.   IF you received an x-ray today, you will receive  an invoice from [facility]. Please contact [facility] at [phone] with questions or concerns regarding your invoice.   IF you received labwork today, you will receive an invoice from [place]. Please contact [facility] at [phone] with questions or concerns regarding your invoice.   Our billing staff will not be able to assist you with questions regarding bills from these companies.  You will be contacted with the lab results as soon as they are available. The fastest way to get your results is to activate your My Chart account. Instructions are located on the last page of this paperwork. If you have not heard from [place] regarding the results in 2 weeks, please contact this office.         Signed, Merri Ray, MD [facility]

## 2020-02-10 LAB — CMP14+EGFR
ALT: 30 IU/L (ref 0–44)
AST: 52 IU/L — ABNORMAL HIGH (ref 0–40)
Albumin/Globulin Ratio: 1.3 (ref 1.2–2.2)
Albumin: 4.7 g/dL (ref 4.0–5.0)
Alkaline Phosphatase: 119 IU/L (ref 44–121)
BUN/Creatinine Ratio: 18 (ref 9–20)
BUN: 14 mg/dL (ref 6–24)
Bilirubin Total: <0.2 mg/dL (ref 0.0–1.2)
CO2: 24 mmol/L (ref 20–29)
Calcium: 9.8 mg/dL (ref 8.7–10.2)
Chloride: 97 mmol/L (ref 96–106)
Creatinine, Ser: 0.8 mg/dL (ref 0.76–1.27)
GFR calc Af Amer: 124 mL/min/{1.73_m2} (ref >59)
GFR calc non Af Amer: 107 mL/min/{1.73_m2} (ref >59)
Globulin, Total: 3.6 g/dL (ref 1.5–4.5)
Glucose: 114 mg/dL — ABNORMAL HIGH (ref 65–99)
Potassium: 4.3 mmol/L (ref 3.5–5.2)
Sodium: 137 mmol/L (ref 134–144)
Total Protein: 8.3 g/dL (ref 6.0–8.5)

## 2020-02-10 LAB — LIPID PANEL
Chol/HDL Ratio: 4.8 ratio (ref 0.0–5.0)
Cholesterol, Total: 225 mg/dL — ABNORMAL HIGH (ref 100–199)
HDL: 47 mg/dL (ref >39)
LDL Chol Calc (NIH): 127 mg/dL — ABNORMAL HIGH (ref 0–99)
Triglycerides: 291 mg/dL — ABNORMAL HIGH (ref 0–149)
VLDL Cholesterol Cal: 51 mg/dL — ABNORMAL HIGH (ref 5–40)

## 2020-02-10 LAB — HEMOGLOBIN A1C
Est. average glucose Bld gHb Est-mCnc: 134 mg/dL
Hgb A1c MFr Bld: 6.3 % — ABNORMAL HIGH (ref 4.8–5.6)

## 2020-08-08 ENCOUNTER — Ambulatory Visit: Payer: Self-pay | Admitting: Family Medicine

## 2020-09-06 ENCOUNTER — Ambulatory Visit (INDEPENDENT_AMBULATORY_CARE_PROVIDER_SITE_OTHER): Payer: BC Managed Care – PPO | Admitting: Family Medicine

## 2020-09-06 ENCOUNTER — Other Ambulatory Visit: Payer: Self-pay

## 2020-09-06 VITALS — BP 128/78 | HR 97 | Temp 98.3°F | Resp 16 | Ht 64.0 in | Wt 150.6 lb

## 2020-09-06 DIAGNOSIS — I10 Essential (primary) hypertension: Secondary | ICD-10-CM | POA: Diagnosis not present

## 2020-09-06 DIAGNOSIS — M545 Low back pain, unspecified: Secondary | ICD-10-CM | POA: Diagnosis not present

## 2020-09-06 DIAGNOSIS — E1165 Type 2 diabetes mellitus with hyperglycemia: Secondary | ICD-10-CM | POA: Diagnosis not present

## 2020-09-06 DIAGNOSIS — E785 Hyperlipidemia, unspecified: Secondary | ICD-10-CM

## 2020-09-06 LAB — COMPREHENSIVE METABOLIC PANEL WITH GFR
ALT: 20 U/L (ref 0–53)
AST: 27 U/L (ref 0–37)
Albumin: 5 g/dL (ref 3.5–5.2)
Alkaline Phosphatase: 101 U/L (ref 39–117)
BUN: 18 mg/dL (ref 6–23)
CO2: 26 meq/L (ref 19–32)
Calcium: 10.3 mg/dL (ref 8.4–10.5)
Chloride: 98 meq/L (ref 96–112)
Creatinine, Ser: 0.82 mg/dL (ref 0.40–1.50)
GFR: 105.05 mL/min
Glucose, Bld: 100 mg/dL — ABNORMAL HIGH (ref 70–99)
Potassium: 4.5 meq/L (ref 3.5–5.1)
Sodium: 136 meq/L (ref 135–145)
Total Bilirubin: 0.4 mg/dL (ref 0.2–1.2)
Total Protein: 8.6 g/dL — ABNORMAL HIGH (ref 6.0–8.3)

## 2020-09-06 LAB — HEMOGLOBIN A1C: Hgb A1c MFr Bld: 7.2 % — ABNORMAL HIGH (ref 4.6–6.5)

## 2020-09-06 LAB — LIPID PANEL
Cholesterol: 191 mg/dL (ref 0–200)
HDL: 57.7 mg/dL (ref >39.00)
NonHDL: 133.4
Total CHOL/HDL Ratio: 3
Triglycerides: 265 mg/dL — ABNORMAL HIGH (ref 0.0–149.0)
VLDL: 53 mg/dL — ABNORMAL HIGH (ref 0.0–40.0)

## 2020-09-06 LAB — LDL CHOLESTEROL, DIRECT: Direct LDL: 97 mg/dL

## 2020-09-06 MED ORDER — ATORVASTATIN CALCIUM 10 MG PO TABS: 10.0000 mg | ORAL_TABLET | Freq: Every day | ORAL | 1 refills | Status: DC

## 2020-09-06 MED ORDER — AMLODIPINE BESYLATE 10 MG PO TABS: 10.0000 mg | ORAL_TABLET | Freq: Every day | ORAL | 1 refills | Status: DC

## 2020-09-06 MED ORDER — HYDROCHLOROTHIAZIDE 25 MG PO TABS: 25.0000 mg | ORAL_TABLET | Freq: Every day | ORAL | 1 refills | Status: DC

## 2020-09-06 MED ORDER — CYCLOBENZAPRINE HCL 5 MG PO TABS: 5.0000 mg | ORAL_TABLET | Freq: Three times a day (TID) | ORAL | 0 refills | Status: DC | PRN

## 2020-09-06 NOTE — Patient Instructions (Addendum)
I will check labs today. Continue to try to avoid soda as much as possible, especially the [facility] (19 teaspoons of sugar in each bottle).   Let me know when you are ready for a colonoscopy referral.   I do recommend covid 19 vaccine booster.

## 2020-09-06 NOTE — Progress Notes (Signed)
Subjective:  Patient ID: John Obrien, male    DOB: Dec 20, 1975  Age: 45 y.o. MRN: [id number]  CC:  Chief Complaint  Patient presents with   Hyperlipidemia    Pt here for recheck today no concerns recheck due    Hypertension    Denies physical sxs, no concerns is in need of refill today    Diabetes    Due for retest today along with refill, no concerns, pt reports working on diet to maintain numbers     HPI John Obrien presents for  Diabetes: With hyperglycemia, diet controlled.  Previous prediabetes.  He is on a statin.  Exercising at least 3 days/week with avoidance of sweet tea, fast food usually- still drinking 1 soda per day - 20oz Mtn Dew.  Home readings:none.  Microalbumin: Normal ratio 11/03/2019 Lab Results  Component Value Date   HGBA1C 7.2 (H) 09/06/2020   HGBA1C 6.3 (H) 02/09/2020   HGBA1C 6.5 (H) 11/03/2019   Lab Results  Component Value Date   LDLCALC 127 (H) 02/09/2020   CREATININE 0.82 09/06/2020   Hypertension: Amlodipine 10 mg, hydrochlorothiazide 25 mg daily. Home readings at [facility] - 130/84  BP Readings from Last 3 Encounters:  09/06/20 128/78  02/09/20 140/90  11/03/19 (!) 150/98   Lab Results  Component Value Date   CREATININE 0.82 09/06/2020    Hyperlipidemia: Lipitor 10 mg daily - increased to 20mg  qd in 01/2020. No new myalgia. Some back pain this past week.  Lab Results  Component Value Date   CHOL 191 09/06/2020   HDL 57.70 09/06/2020   LDLCALC 127 (H) 02/09/2020   LDLDIRECT 97.0 09/06/2020   TRIG 265.0 (H) 09/06/2020   CHOLHDL 3 09/06/2020   Lab Results  Component Value Date   ALT 20 09/06/2020   AST 27 09/06/2020   ALKPHOS 101 09/06/2020   BILITOT 0.4 09/06/2020    Colon cancer screening No FH of colon CA or personal hx of polyps.  Screening options with colonoscopy versus Cologuard discussed. Discussed timing of repeat testing intervals if normal, as well as potential need for diagnostic Colonoscopy if positive  Cologuard. Understanding expressed, and chose to wait on testing at this time.   COVID-19 vaccine Primary vaccine in June and July of last year.  Has not had booster.  Recommended.  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 Ok initially, then became more sore past week. Sleeping ok, but more sore.   No bowel or bladder incontinence, no saddle anesthesia, no lower extremity weakness. No night sweats, fever, weight loss.    Tx: ibuprofen 2-4 per day. Helps some. Icy hot pad.    History Patient Active Problem List   Diagnosis Date Noted   Murmur 02/28/2018   Essential hypertension 02/28/2018   Past Medical History:  Diagnosis Date   Hypertension    No past surgical history on file. No Known Allergies Prior to Admission medications   Medication Sig Start Date End Date Taking? Authorizing Provider  amLODipine (NORVASC) 10 MG tablet Take 1 tablet (10 mg total) by mouth daily. 02/09/20  Yes 02/11/20, MD  atorvastatin (LIPITOR) 10 MG tablet Take 1 tablet (10 mg total) by mouth daily. 02/09/20  Yes 02/11/20, MD  hydrochlorothiazide (HYDRODIURIL) 25 MG tablet Take 1 tablet (25 mg total) by mouth daily. 02/09/20  Yes 02/11/20, MD   Social History   Socioeconomic History   Marital status: Single  Spouse name: Not on file   Number of children: Not on file   Years of education: Not on file   Highest education level: Not on file  Occupational History   Not on file  Tobacco Use   Smoking status: Never   Smokeless tobacco: Never  Vaping Use   Vaping Use: Never used  Substance and Sexual Activity   Alcohol use: Yes    Comment: sometimes    Drug use: Never   Sexual activity: Not on file  Other Topics Concern   Not on file  Social History Narrative   Not on file   Social Determinants of Health   Financial Resource Strain: Not on file  Food Insecurity: Not on file  Transportation Needs: Not on file   Physical Activity: Not on file  Stress: Not on file  Social Connections: Not on file  Intimate Partner Violence: Not on file    Review of Systems  Constitutional:  Negative for fatigue and unexpected weight change.  Eyes:  Negative for visual disturbance.  Respiratory:  Negative for cough, chest tightness and shortness of breath.   Cardiovascular:  Negative for chest pain, palpitations and leg swelling.  Gastrointestinal:  Negative for abdominal pain and blood in stool.  Genitourinary:  Negative for difficulty urinating.  Musculoskeletal:  Positive for back pain.  Neurological:  Negative for dizziness, light-headedness and headaches.    Objective:   Vitals:   09/06/20 1048  BP: 128/78  Pulse: 97  Resp: 16  Temp: 98.3 F (36.8 C)  TempSrc: Temporal  SpO2: 98%  Weight: 150 lb 9.6 oz (68.3 kg)  Height: 5\' 4"  (1.626 m)     Physical Exam Vitals reviewed.  Constitutional:      Appearance: He is well-developed.  HENT:     Head: Normocephalic and atraumatic.  Neck:     Vascular: No carotid bruit or JVD.  Cardiovascular:     Rate and Rhythm: Normal rate and regular rhythm.     Heart sounds: Normal heart sounds. No murmur heard. Pulmonary:     Effort: Pulmonary effort is normal.     Breath sounds: Normal breath sounds. No rales.  Musculoskeletal:       Back:     Right lower leg: No edema.     Left lower leg: No edema.     Comments: LS spine: Ttp left lower paraspinals to SI.  No midline bony ttp.  Neg seated SLR. Able to heel and toe walk.   Skin:    General: Skin is warm and dry.  Neurological:     Mental Status: He is alert and oriented to person, place, and time.  Psychiatric:        Mood and Affect: Mood normal.       Assessment & Plan:  John Obrien is a 45 y.o. male . Type 2 diabetes mellitus with hyperglycemia, without long-term current use of insulin (HCC) - Plan: Hemoglobin A1c  -Avoidance of sodas discussed, check A1c.  Essential  hypertension - Plan: Comprehensive metabolic panel, amLODipine (NORVASC) 10 MG tablet, hydrochlorothiazide (HYDRODIURIL) 25 MG tablet  -Stable with current regimen, continue same.  Check labs  Hyperlipidemia, unspecified hyperlipidemia type - Plan: Comprehensive metabolic panel, Lipid panel, atorvastatin (LIPITOR) 10 MG tablet  -Tolerating current dose Lipitor, check labs  Acute left-sided low back pain without sciatica - Plan: cyclobenzaprine (FLEXERIL) 5 MG tablet  -Lumbar strain secondary to muscle spasm.  No red flags on exam or history.  Imaging initially deferred.  Symptomatic care with episodic NSAID, muscle relaxant short-term if needed with potential side effects discussed.  RTC precautions given if not improving  Meds ordered this encounter  Medications   amLODipine (NORVASC) 10 MG tablet    Sig: Take 1 tablet (10 mg total) by mouth daily.    Dispense:  90 tablet    Refill:  1   atorvastatin (LIPITOR) 10 MG tablet    Sig: Take 1 tablet (10 mg total) by mouth daily.    Dispense:  90 tablet    Refill:  1   hydrochlorothiazide (HYDRODIURIL) 25 MG tablet    Sig: Take 1 tablet (25 mg total) by mouth daily.    Dispense:  90 tablet    Refill:  1   cyclobenzaprine (FLEXERIL) 5 MG tablet    Sig: Take 1 tablet (5 mg total) by mouth 3 (three) times daily as needed for muscle spasms (start at bedtime due to sedation).    Dispense:  15 tablet    Refill:  0   Patient Instructions  I will check labs today. Continue to try to avoid soda as much as possible, especially the [facility] (19 teaspoons of sugar in each bottle).   Let me know when you are ready for a colonoscopy referral.   I do recommend covid 19 vaccine booster.      Signed,   Meredith Staggers, MD [facility], [facility] 09/06/20 10:10 PM

## 2021-09-11 ENCOUNTER — Ambulatory Visit (INDEPENDENT_AMBULATORY_CARE_PROVIDER_SITE_OTHER): Payer: Self-pay | Admitting: Family Medicine

## 2021-09-11 ENCOUNTER — Encounter: Payer: Self-pay | Admitting: Family Medicine

## 2021-09-11 VITALS — BP 136/72 | HR 83 | Temp 98.1°F | Resp 16 | Ht 63.5 in | Wt 143.2 lb

## 2021-09-11 DIAGNOSIS — Z1211 Encounter for screening for malignant neoplasm of colon: Secondary | ICD-10-CM

## 2021-09-11 DIAGNOSIS — Z0000 Encounter for general adult medical examination without abnormal findings: Secondary | ICD-10-CM

## 2021-09-11 DIAGNOSIS — E785 Hyperlipidemia, unspecified: Secondary | ICD-10-CM

## 2021-09-11 DIAGNOSIS — I10 Essential (primary) hypertension: Secondary | ICD-10-CM

## 2021-09-11 DIAGNOSIS — E1165 Type 2 diabetes mellitus with hyperglycemia: Secondary | ICD-10-CM

## 2021-09-11 LAB — COMPREHENSIVE METABOLIC PANEL
ALT: 16 U/L (ref 0–53)
AST: 29 U/L (ref 0–37)
Albumin: 4.5 g/dL (ref 3.5–5.2)
Alkaline Phosphatase: 68 U/L (ref 39–117)
BUN: 10 mg/dL (ref 6–23)
CO2: 29 mEq/L (ref 19–32)
Calcium: 9.6 mg/dL (ref 8.4–10.5)
Chloride: 98 mEq/L (ref 96–112)
Creatinine, Ser: 0.72 mg/dL (ref 0.40–1.50)
GFR: 108.48 mL/min (ref >60.00)
Glucose, Bld: 103 mg/dL — ABNORMAL HIGH (ref 70–99)
Potassium: 2.9 mEq/L — ABNORMAL LOW (ref 3.5–5.1)
Sodium: 135 mEq/L (ref 135–145)
Total Bilirubin: 0.4 mg/dL (ref 0.2–1.2)
Total Protein: 8.4 g/dL — ABNORMAL HIGH (ref 6.0–8.3)

## 2021-09-11 LAB — MICROALBUMIN / CREATININE URINE RATIO
Creatinine,U: 56.5 mg/dL
Microalb Creat Ratio: 1.2 mg/g (ref 0.0–30.0)
Microalb, Ur: <0.7 mg/dL (ref 0.0–1.9)

## 2021-09-11 LAB — LIPID PANEL
Cholesterol: 231 mg/dL — ABNORMAL HIGH (ref 0–200)
HDL: 54.5 mg/dL (ref >39.00)
NonHDL: 176.02
Total CHOL/HDL Ratio: 4
Triglycerides: 224 mg/dL — ABNORMAL HIGH (ref 0.0–149.0)
VLDL: 44.8 mg/dL — ABNORMAL HIGH (ref 0.0–40.0)

## 2021-09-11 LAB — HEMOGLOBIN A1C: Hgb A1c MFr Bld: 6.3 % (ref 4.6–6.5)

## 2021-09-11 LAB — LDL CHOLESTEROL, DIRECT: Direct LDL: 134 mg/dL

## 2021-09-11 MED ORDER — AMLODIPINE BESYLATE 10 MG PO TABS
10.0000 mg | ORAL_TABLET | Freq: Every day | ORAL | 1 refills | Status: DC
Start: 2021-09-11 — End: 2021-12-12

## 2021-09-11 MED ORDER — ATORVASTATIN CALCIUM 10 MG PO TABS: 10.0000 mg | ORAL_TABLET | Freq: Every day | ORAL | 1 refills | Status: DC

## 2021-09-11 NOTE — Progress Notes (Signed)
Subjective:  Patient ID: John Obrien, male    DOB: 1975/07/14  Age: 46 y.o. MRN: [id number]  CC:  Chief Complaint  Patient presents with   Annual Exam    Pt here for annual, notes he is fasting for labs     HPI John Obrien presents for Annual Exam  Hypertension: Treated with amlodipine 10 mg daily. Off hctz.  Compliant, denies missed doses.  Home readings: 130/70 at [facility].  BP Readings from Last 3 Encounters:  09/11/21 136/72  09/06/20 128/78  02/09/20 140/90   Lab Results  Component Value Date   CREATININE 0.82 09/06/2020   Hyperlipidemia: Lipitor 10 mg daily - taking 2 per day, denies missed doses - out for 1 week.  Lab Results  Component Value Date   CHOL 191 09/06/2020   HDL 57.70 09/06/2020   LDLCALC 127 (H) 02/09/2020   LDLDIRECT 97.0 09/06/2020   TRIG 265.0 (H) 09/06/2020   CHOLHDL 3 09/06/2020   Lab Results  Component Value Date   ALT 20 09/06/2020   AST 27 09/06/2020   ALKPHOS 101 09/06/2020   BILITOT 0.4 09/06/2020   Diabetes: With prior hyperglycemia, diet controlled.  He is on statin, exercising, avoiding sweet tea at last visit in June 2022 but still some soda.  Avoidance discussed.  Has not had testing since that time. No current meds.  Soda 3 days per week- can instead of a bottle.  No home readings. Working days - not nights, less soda.  Microalbumin: due.  Optho, foot exam, pneumovax: up to date. Optho June 2nd.   Lab Results  Component Value Date   HGBA1C 7.2 (H) 09/06/2020   HGBA1C 6.3 (H) 02/09/2020   HGBA1C 6.5 (H) 11/03/2019   Lab Results  Component Value Date   LDLCALC 127 (H) 02/09/2020   CREATININE 0.82 09/06/2020          09/11/2021    1:02 PM 09/06/2020   10:53 AM 11/03/2019    9:31 AM 08/26/2019    2:51 PM 07/13/2019    8:47 AM  Depression screen PHQ 2/9  Decreased Interest 0 0 0 0 0  Down, Depressed, Hopeless 0 0 0 0 0  PHQ - 2 Score 0 0 0 0 0  Altered sleeping  0     Tired, decreased energy  0      Change in appetite  0     Feeling bad or failure about yourself   0     Trouble concentrating  0     Moving slowly or fidgety/restless  0     Suicidal thoughts  0     PHQ-9 Score  0       Health Maintenance  Topic Date Due   URINE MICROALBUMIN  11/02/2020   COVID-19 Vaccine (3 - Pfizer series) 09/27/2021 (Originally 11/30/2019)   COLONOSCOPY (Pts 45-62yrs Insurance coverage will need to be confirmed)  09/12/2022 (Originally 10/10/2018)   Hepatitis C Screening  09/12/2022 (Originally 10/10/1991)   TETANUS/TDAP  01/20/2027   HIV Screening  Completed   HPV VACCINES  Aged Out   INFLUENZA VACCINE  Discontinued  Colon: screening discussed last year declined. Agrees to referral for colonosopcy after option of Cologuard.  Prostate: does NOT have family history of prostate cancer The natural history of prostate cancer and ongoing controversy regarding screening and potential treatment outcomes of prostate cancer has been discussed with the patient. The meaning of a false positive PSA and a false negative PSA has been  discussed. He indicates understanding of the limitations of this screening test and wishes NOT to proceed with screening PSA testing. No results found for: "PSA1", "PSA"   Immunization History  Administered Date(s) Administered   PFIZER(Purple Top)SARS-COV-2 Vaccination 09/13/2019, 10/05/2019   Tdap 01/19/2017  Covid booster - recommended.   No results found. - optho visit June 2nd.   Dental: yesterday. Every 6 months.   Alcohol: up to 2 per day. Some days less.   Tobacco: none  Exercise:51min per day - situp, pushup, jumping acks and riding bike 65min 2 times  per week.    History Patient Active Problem List   Diagnosis Date Noted   Murmur 02/28/2018   Essential hypertension 02/28/2018   Past Medical History:  Diagnosis Date   Hypertension    History reviewed. No pertinent surgical history. No Known Allergies Prior to Admission medications   Medication Sig  Start Date End Date Taking? Authorizing Provider  amLODipine (NORVASC) 10 MG tablet Take 1 tablet (10 mg total) by mouth daily. 09/06/20  Yes Wendie Agreste, MD  atorvastatin (LIPITOR) 10 MG tablet Take 1 tablet (10 mg total) by mouth daily. 09/06/20  Yes Wendie Agreste, MD  cyclobenzaprine (FLEXERIL) 5 MG tablet Take 1 tablet (5 mg total) by mouth 3 (three) times daily as needed for muscle spasms (start at bedtime due to sedation). 09/06/20  Yes Wendie Agreste, MD  hydrochlorothiazide (HYDRODIURIL) 25 MG tablet Take 1 tablet (25 mg total) by mouth daily. 09/06/20   Wendie Agreste, MD   Social History   Socioeconomic History   Marital status: Single    Spouse name: Not on file   Number of children: Not on file   Years of education: Not on file   Highest education level: Not on file  Occupational History   Not on file  Tobacco Use   Smoking status: Never   Smokeless tobacco: Never  Vaping Use   Vaping Use: Never used  Substance and Sexual Activity   Alcohol use: Yes    Alcohol/week: 3.0 standard drinks of alcohol    Types: 3 Shots of liquor per week   Drug use: Never   Sexual activity: Yes  Other Topics Concern   Not on file  Social History Narrative   Not on file   Social Determinants of Health   Financial Resource Strain: Not on file  Food Insecurity: Not on file  Transportation Needs: Not on file  Physical Activity: Not on file  Stress: Not on file  Social Connections: Not on file  Intimate Partner Violence: Not on file    Review of Systems  13 point review of systems per patient health survey noted.  Negative other than as indicated above or in HPI.   Objective:   Vitals:   09/11/21 1259  BP: 136/72  Pulse: 83  Resp: 16  Temp: 98.1 F (36.7 C)  TempSrc: Temporal  SpO2: 100%  Weight: 143 lb 3.2 oz (65 kg)  Height: 5' 3.5" (1.613 m)     Physical Exam Vitals reviewed.  Constitutional:      Appearance: He is well-developed.  HENT:     Head:  Normocephalic and atraumatic.     Right Ear: External ear normal.     Left Ear: External ear normal.  Eyes:     Conjunctiva/sclera: Conjunctivae normal.     Pupils: Pupils are equal, round, and reactive to light.  Neck:     Thyroid: No thyromegaly.  Cardiovascular:  Rate and Rhythm: Normal rate and regular rhythm.     Heart sounds: Normal heart sounds.  Pulmonary:     Effort: Pulmonary effort is normal. No respiratory distress.     Breath sounds: Normal breath sounds. No wheezing.  Abdominal:     General: There is no distension.     Palpations: Abdomen is soft.     Tenderness: There is no abdominal tenderness.  Musculoskeletal:        General: No tenderness. Normal range of motion.     Cervical back: Normal range of motion and neck supple.  Lymphadenopathy:     Cervical: No cervical adenopathy.  Skin:    General: Skin is warm and dry.  Neurological:     Mental Status: He is alert and oriented to person, place, and time.     Deep Tendon Reflexes: Reflexes are normal and symmetric.  Psychiatric:        Behavior: Behavior normal.        Assessment & Plan:  John Obrien is a 46 y.o. male . Annual physical exam  - -anticipatory guidance as below in AVS, screening labs above. Health maintenance items as above in HPI discussed/recommended as applicable.   Type 2 diabetes mellitus with hyperglycemia, without long-term current use of insulin (HCC)  -Check updated A1c, stressed importance of ongoing follow-up and intervals on follow-up to monitor glycemic control.  Last A1c was elevated.  Check labs to decide on medication adjustments.  86-month follow-up.  Hyperlipidemia, unspecified hyperlipidemia type  -Reports use of statin 2/day, but based on last prescription once daily dosing.  Off meds past week.  Check labs, restart at once per day for now, can discuss further at follow-up if elevated readings.  Essential hypertension  -Stable on current regimen of amlodipine  alone.  We will continue same.  Monitor for elevations and consider restart of HCTZ.  Meds ordered this encounter  Medications   amLODipine (NORVASC) 10 MG tablet    Sig: Take 1 tablet (10 mg total) by mouth daily.    Dispense:  90 tablet    Refill:  1   atorvastatin (LIPITOR) 10 MG tablet    Sig: Take 1 tablet (10 mg total) by mouth daily.    Dispense:  90 tablet    Refill:  1   Patient Instructions   Make sure to see me every 3-6 months for diabetes, cholesterol, blood pressure.  I will refer you for colonoscopy I recommend covid vaccine booster.  Thanks for coming in today.   Preventive Care 28-38 Years Old, Male Preventive care refers to lifestyle choices and visits with your health care provider that can promote health and wellness. Preventive care visits are also called wellness exams. What can I expect for my preventive care visit? Counseling During your preventive care visit, your health care provider may ask about your: Medical history, including: Past medical problems. Family medical history. Current health, including: Emotional well-being. Home life and relationship well-being. Sexual activity. Lifestyle, including: Alcohol, nicotine or tobacco, and drug use. Access to firearms. Diet, exercise, and sleep habits. Safety issues such as seatbelt and bike helmet use. Sunscreen use. Work and work Astronomer. Physical exam Your health care provider will check your: Height and weight. These may be used to calculate your BMI (body mass index). BMI is a measurement that tells if you are at a healthy weight. Waist circumference. This measures the distance around your waistline. This measurement also tells if you are at a healthy weight  and may help predict your risk of certain diseases, such as type 2 diabetes and high blood pressure. Heart rate and blood pressure. Body temperature. Skin for abnormal spots. What immunizations do I need?  Vaccines are usually given at  various ages, according to a schedule. Your health care provider will recommend vaccines for you based on your age, medical history, and lifestyle or other factors, such as travel or where you work. What tests do I need? Screening Your health care provider may recommend screening tests for certain conditions. This may include: Lipid and cholesterol levels. Diabetes screening. This is done by checking your blood sugar (glucose) after you have not eaten for a while (fasting). Hepatitis B test. Hepatitis C test. HIV (human immunodeficiency virus) test. STI (sexually transmitted infection) testing, if you are at risk. Lung cancer screening. Prostate cancer screening. Colorectal cancer screening. Talk with your health care provider about your test results, treatment options, and if necessary, the need for more tests. Follow these instructions at home: Eating and drinking  Eat a diet that includes fresh fruits and vegetables, whole grains, lean protein, and low-fat dairy products. Take vitamin and mineral supplements as recommended by your health care provider. Do not drink alcohol if your health care provider tells you not to drink. If you drink alcohol: Limit how much you have to 0-2 drinks a day. Know how much alcohol is in your drink. In the [place] one drink equals one 12 oz bottle of beer (355 mL), one 5 oz glass of wine (148 mL), or one 1 oz glass of hard liquor (44 mL). Lifestyle Brush your teeth every morning and night with fluoride toothpaste. Floss one time each day. Exercise for at least 30 minutes 5 or more days each week. Do not use any products that contain nicotine or tobacco. These products include cigarettes, chewing tobacco, and vaping devices, such as e-cigarettes. If you need help quitting, ask your health care provider. Do not use drugs. If you are sexually active, practice safe sex. Use a condom or other form of protection to prevent STIs. Take aspirin only as told by  your health care provider. Make sure that you understand how much to take and what form to take. Work with your health care provider to find out whether it is safe and beneficial for you to take aspirin daily. Find healthy ways to manage stress, such as: Meditation, yoga, or listening to music. Journaling. Talking to a trusted person. Spending time with friends and family. Minimize exposure to UV radiation to reduce your risk of skin cancer. Safety Always wear your seat belt while driving or riding in a vehicle. Do not drive: If you have been drinking alcohol. Do not ride with someone who has been drinking. When you are tired or distracted. While texting. If you have been using any mind-altering substances or drugs. Wear a helmet and other protective equipment during sports activities. If you have firearms in your house, make sure you follow all gun safety procedures. What's next? Go to your health care provider once a year for an annual wellness visit. Ask your health care provider how often you should have your eyes and teeth checked. Stay up to date on all vaccines. This information is not intended to replace advice given to you by your health care provider. Make sure you discuss any questions you have with your health care provider. Document Revised: 09/05/2020 Document Reviewed: 09/05/2020 Elsevier Patient Education  [place],  Merri Ray, MD [place] 09/11/21 1:38 PM

## 2021-09-11 NOTE — Patient Instructions (Addendum)
Make sure to see me every 3-6 months for diabetes, cholesterol, blood pressure.  I will refer you for colonoscopy I recommend covid vaccine booster.  Thanks for coming in today.   Preventive Care 22-47 Years Old, Male Preventive care refers to lifestyle choices and visits with your health care provider that can promote health and wellness. Preventive care visits are also called wellness exams. What can I expect for my preventive care visit? Counseling During your preventive care visit, your health care provider may ask about your: Medical history, including: Past medical problems. Family medical history. Current health, including: Emotional well-being. Home life and relationship well-being. Sexual activity. Lifestyle, including: Alcohol, nicotine or tobacco, and drug use. Access to firearms. Diet, exercise, and sleep habits. Safety issues such as seatbelt and bike helmet use. Sunscreen use. Work and work Astronomer. Physical exam Your health care provider will check your: Height and weight. These may be used to calculate your BMI (body mass index). BMI is a measurement that tells if you are at a healthy weight. Waist circumference. This measures the distance around your waistline. This measurement also tells if you are at a healthy weight and may help predict your risk of certain diseases, such as type 2 diabetes and high blood pressure. Heart rate and blood pressure. Body temperature. Skin for abnormal spots. What immunizations do I need?  Vaccines are usually given at various ages, according to a schedule. Your health care provider will recommend vaccines for you based on your age, medical history, and lifestyle or other factors, such as travel or where you work. What tests do I need? Screening Your health care provider may recommend screening tests for certain conditions. This may include: Lipid and cholesterol levels. Diabetes screening. This is done by checking your  blood sugar (glucose) after you have not eaten for a while (fasting). Hepatitis B test. Hepatitis C test. HIV (human immunodeficiency virus) test. STI (sexually transmitted infection) testing, if you are at risk. Lung cancer screening. Prostate cancer screening. Colorectal cancer screening. Talk with your health care provider about your test results, treatment options, and if necessary, the need for more tests. Follow these instructions at home: Eating and drinking  Eat a diet that includes fresh fruits and vegetables, whole grains, lean protein, and low-fat dairy products. Take vitamin and mineral supplements as recommended by your health care provider. Do not drink alcohol if your health care provider tells you not to drink. If you drink alcohol: Limit how much you have to 0-2 drinks a day. Know how much alcohol is in your drink. In the [place] one drink equals one 12 oz bottle of beer (355 mL), one 5 oz glass of wine (148 mL), or one 1 oz glass of hard liquor (44 mL). Lifestyle Brush your teeth every morning and night with fluoride toothpaste. Floss one time each day. Exercise for at least 30 minutes 5 or more days each week. Do not use any products that contain nicotine or tobacco. These products include cigarettes, chewing tobacco, and vaping devices, such as e-cigarettes. If you need help quitting, ask your health care provider. Do not use drugs. If you are sexually active, practice safe sex. Use a condom or other form of protection to prevent STIs. Take aspirin only as told by your health care provider. Make sure that you understand how much to take and what form to take. Work with your health care provider to find out whether it is safe and beneficial for you to take aspirin daily.  Find healthy ways to manage stress, such as: Meditation, yoga, or listening to music. Journaling. Talking to a trusted person. Spending time with friends and family. Minimize exposure to UV radiation  to reduce your risk of skin cancer. Safety Always wear your seat belt while driving or riding in a vehicle. Do not drive: If you have been drinking alcohol. Do not ride with someone who has been drinking. When you are tired or distracted. While texting. If you have been using any mind-altering substances or drugs. Wear a helmet and other protective equipment during sports activities. If you have firearms in your house, make sure you follow all gun safety procedures. What's next? Go to your health care provider once a year for an annual wellness visit. Ask your health care provider how often you should have your eyes and teeth checked. Stay up to date on all vaccines. This information is not intended to replace advice given to you by your health care provider. Make sure you discuss any questions you have with your health care provider. Document Revised: 09/05/2020 Document Reviewed: 09/05/2020 Elsevier Patient Education  2023 [employer].

## 2021-09-17 ENCOUNTER — Other Ambulatory Visit: Payer: Self-pay | Admitting: Family Medicine

## 2021-09-17 DIAGNOSIS — E876 Hypokalemia: Secondary | ICD-10-CM

## 2021-09-30 ENCOUNTER — Telehealth: Payer: Self-pay

## 2021-09-30 NOTE — Telephone Encounter (Signed)
-----   Message from Shade Flood, MD sent at 09/29/2021 10:22 PM EDT ----- See last message - has patient been called? Thanks.

## 2021-10-01 ENCOUNTER — Telehealth: Payer: Self-pay | Admitting: Family Medicine

## 2021-10-01 NOTE — Telephone Encounter (Signed)
See lab result note.

## 2021-10-01 NOTE — Telephone Encounter (Signed)
Caller name: Will[place] (pt)  On DPR? :yes/no: Yes  Call back number: [phone]  Provider they see: Dr. Neva Seat  Reason for call: Pt returning call re: lab results from yesterday. Please call back.

## 2021-10-01 NOTE — Telephone Encounter (Signed)
Pt called back to discuss labs, I was unavailable.  Called pt back and read lab results as directed. Pt notes he has not had any of the mentioned symptoms or fatigue. Will have labs done today or tomorrow at [facility] lab.

## 2021-12-12 ENCOUNTER — Ambulatory Visit: Payer: Self-pay | Admitting: Family Medicine

## 2021-12-12 ENCOUNTER — Other Ambulatory Visit (INDEPENDENT_AMBULATORY_CARE_PROVIDER_SITE_OTHER): Payer: BC Managed Care – PPO

## 2021-12-12 ENCOUNTER — Encounter: Payer: Self-pay | Admitting: Family Medicine

## 2021-12-12 ENCOUNTER — Ambulatory Visit: Payer: BC Managed Care – PPO | Admitting: Family Medicine

## 2021-12-12 VITALS — BP 138/88 | HR 81 | Temp 98.1°F | Ht 63.5 in | Wt 146.8 lb

## 2021-12-12 DIAGNOSIS — E785 Hyperlipidemia, unspecified: Secondary | ICD-10-CM | POA: Diagnosis not present

## 2021-12-12 DIAGNOSIS — E1165 Type 2 diabetes mellitus with hyperglycemia: Secondary | ICD-10-CM | POA: Diagnosis not present

## 2021-12-12 DIAGNOSIS — E876 Hypokalemia: Secondary | ICD-10-CM

## 2021-12-12 DIAGNOSIS — I10 Essential (primary) hypertension: Secondary | ICD-10-CM | POA: Diagnosis not present

## 2021-12-12 LAB — BASIC METABOLIC PANEL
BUN: 9 mg/dL (ref 6–23)
CO2: 29 mEq/L (ref 19–32)
Calcium: 9.9 mg/dL (ref 8.4–10.5)
Chloride: 99 mEq/L (ref 96–112)
Creatinine, Ser: 0.76 mg/dL (ref 0.40–1.50)
GFR: 106.54 mL/min (ref >60.00)
Glucose, Bld: 102 mg/dL — ABNORMAL HIGH (ref 70–99)
Potassium: 4.2 mEq/L (ref 3.5–5.1)
Sodium: 136 mEq/L (ref 135–145)

## 2021-12-12 MED ORDER — AMLODIPINE BESYLATE 10 MG PO TABS: 10.0000 mg | ORAL_TABLET | Freq: Every day | ORAL | 1 refills | Status: DC

## 2021-12-12 MED ORDER — ATORVASTATIN CALCIUM 20 MG PO TABS: 20.0000 mg | ORAL_TABLET | Freq: Every day | ORAL | 1 refills | Status: DC

## 2021-12-12 NOTE — Patient Instructions (Signed)
Increase lipitor to 20mg  per day (you can double up on the 10mg , but I did send the new dose to your pharmacy).  Continue potassium rich foods. If low potassium today, may need supplement - I will let you know. No other med changes for now.

## 2021-12-12 NOTE — Progress Notes (Signed)
Subjective:  Patient ID: John Obrien, male    DOB: Mar 18, 1976  Age: 46 y.o. MRN: [id number]  CC:  Chief Complaint  Patient presents with   Hypertension    Pt states all is well   Hyperlipidemia   Diabetes    HPI John Obrien presents for   Diabetes: Diet controlled stable A1c in June.  Prior hyperglycemia.  Decrease soda has been discussed previously.  He did cut back on sweet tea prior.  Less soda at last visit.  He is on statin. Was on statin last visit LDL 134.  No home readings.  Microalbumin: Normal ratio 09/11/2021  Lab Results  Component Value Date   HGBA1C 6.3 09/11/2021   HGBA1C 7.2 (H) 09/06/2020   HGBA1C 6.3 (H) 02/09/2020   Lab Results  Component Value Date   MICROALBUR <0.7 09/11/2021   LDLCALC 127 (H) 02/09/2020   CREATININE 0.72 09/11/2021    Hypertension: With hypokalemia.  See prior labs.  Treated with amlodipine.  He was off the previous HCTZ at last visit. Potassium 2.9 on 09/11/2021.  Plan for repeat labs, message was left for patient.  Labs were not repeated. He did receive message, did not have checked as  he was feeling well.  No new fatigue, HA, heart palpitations. No supplement, but eating more potassium rich foods.  Home readings: 130/80. No new  No added salt. No frozen foods.Avoiding fast food., take out. Home food prep.  BP Readings from Last 3 Encounters:  12/12/21 138/88  09/11/21 136/72  09/06/20 128/78   Lab Results  Component Value Date   CREATININE 0.72 09/11/2021     History Patient Active Problem List   Diagnosis Date Noted   Murmur 02/28/2018   Essential hypertension 02/28/2018   Past Medical History:  Diagnosis Date   Hypertension    No past surgical history on file. No Known Allergies Prior to Admission medications   Medication Sig Start Date End Date Taking? Authorizing Provider  amLODipine (NORVASC) 10 MG tablet Take 1 tablet (10 mg total) by mouth daily. 09/11/21  Yes Wendie Agreste, MD   atorvastatin (LIPITOR) 10 MG tablet Take 1 tablet (10 mg total) by mouth daily. 09/11/21  Yes Wendie Agreste, MD  cyclobenzaprine (FLEXERIL) 5 MG tablet Take 1 tablet (5 mg total) by mouth 3 (three) times daily as needed for muscle spasms (start at bedtime due to sedation). Patient not taking: Reported on 12/12/2021 09/06/20   Wendie Agreste, MD   Social History   Socioeconomic History   Marital status: Single    Spouse name: Not on file   Number of children: Not on file   Years of education: Not on file   Highest education level: Not on file  Occupational History   Not on file  Tobacco Use   Smoking status: Never   Smokeless tobacco: Never  Vaping Use   Vaping Use: Never used  Substance and Sexual Activity   Alcohol use: Yes    Alcohol/week: 3.0 standard drinks of alcohol    Types: 3 Shots of liquor per week   Drug use: Never   Sexual activity: Yes  Other Topics Concern   Not on file  Social History Narrative   Not on file   Social Determinants of Health   Financial Resource Strain: Not on file  Food Insecurity: Not on file  Transportation Needs: Not on file  Physical Activity: Not on file  Stress: Not on file  Social Connections: Not  on file  Intimate Partner Violence: Not on file    Review of Systems  Constitutional:  Negative for fatigue and unexpected weight change.  Eyes:  Negative for visual disturbance.  Respiratory:  Negative for cough, chest tightness and shortness of breath.   Cardiovascular:  Negative for chest pain, palpitations and leg swelling.  Gastrointestinal:  Negative for abdominal pain and blood in stool.  Neurological:  Negative for dizziness, light-headedness and headaches.     Objective:   Vitals:   12/12/21 0824  BP: 138/88  Pulse: 81  Temp: 98.1 F (36.7 C)  SpO2: 100%  Weight: 146 lb 12.8 oz (66.6 kg)  Height: 5' 3.5" (1.613 m)     Physical Exam Vitals reviewed.  Constitutional:      Appearance: He is well-developed.   HENT:     Head: Normocephalic and atraumatic.  Neck:     Vascular: No carotid bruit or JVD.  Cardiovascular:     Rate and Rhythm: Normal rate and regular rhythm.     Heart sounds: Normal heart sounds. No murmur heard. Pulmonary:     Effort: Pulmonary effort is normal.     Breath sounds: Normal breath sounds. No rales.  Musculoskeletal:     Right lower leg: No edema.     Left lower leg: No edema.  Skin:    General: Skin is warm and dry.  Neurological:     Mental Status: He is alert and oriented to person, place, and time.  Psychiatric:        Mood and Affect: Mood normal.        Assessment & Plan:  John Obrien is a 46 y.o. male . Hypokalemia - Plan: Basic metabolic panel  -Low as above at his last visit, unfortunately was not rechecked but he has increased his potassium in diet.  Asymptomatic.  Repeat labs, consider supplementation, continue potassium rich foods.  Hyperlipidemia, unspecified hyperlipidemia type - Plan: atorvastatin (LIPITOR) 20 MG tablet  -Elevated LDL on last labs, will increase Lipitor to 20 mg daily, recheck labs next visit.  RTC precautions if new side effects.  Essential hypertension - Plan: amLODipine (NORVASC) 10 MG tablet  -Borderline, continue same dose amlodipine for now, handout given on salty 6 but on history diet appears to be doing well.  Home monitoring with RTC precautions if persistent elevations.  Check BMP with hypokalemia as above.  Type 2 diabetes mellitus with hyperglycemia, without long-term current use of insulin (HCC) - Plan: Hemoglobin A1c  -Stable at last visit, continue diet/exercise approach.  80-month follow-up.  Meds ordered this encounter  Medications   atorvastatin (LIPITOR) 20 MG tablet    Sig: Take 1 tablet (20 mg total) by mouth daily.    Dispense:  90 tablet    Refill:  1   amLODipine (NORVASC) 10 MG tablet    Sig: Take 1 tablet (10 mg total) by mouth daily.    Dispense:  90 tablet    Refill:  1   Patient  Instructions  Increase lipitor to 20mg  per day (you can double up on the 10mg , but I did send the new dose to your pharmacy).  Continue potassium rich foods. If low potassium today, may need supplement - I will let you know. No other med changes for now.     Signed,   Merri Ray, MD [place] 12/12/21 9:03 AM

## 2021-12-16 LAB — HEMOGLOBIN A1C: Hgb A1c MFr Bld: 6.4 % (ref 4.6–6.5)

## 2022-06-02 ENCOUNTER — Other Ambulatory Visit: Payer: Self-pay | Admitting: Family Medicine

## 2022-06-02 DIAGNOSIS — E785 Hyperlipidemia, unspecified: Secondary | ICD-10-CM

## 2022-06-13 ENCOUNTER — Ambulatory Visit: Payer: BC Managed Care – PPO | Admitting: Family Medicine

## 2022-06-13 ENCOUNTER — Encounter: Payer: Self-pay | Admitting: Family Medicine

## 2022-06-13 VITALS — BP 128/66 | HR 84 | Temp 97.8°F | Ht 63.0 in | Wt 155.2 lb

## 2022-06-13 DIAGNOSIS — Z1211 Encounter for screening for malignant neoplasm of colon: Secondary | ICD-10-CM

## 2022-06-13 DIAGNOSIS — Z1159 Encounter for screening for other viral diseases: Secondary | ICD-10-CM

## 2022-06-13 DIAGNOSIS — I10 Essential (primary) hypertension: Secondary | ICD-10-CM

## 2022-06-13 DIAGNOSIS — E119 Type 2 diabetes mellitus without complications: Secondary | ICD-10-CM | POA: Diagnosis not present

## 2022-06-13 DIAGNOSIS — E785 Hyperlipidemia, unspecified: Secondary | ICD-10-CM

## 2022-06-13 LAB — LIPID PANEL
Cholesterol: 157 mg/dL (ref 0–200)
HDL: 47.9 mg/dL (ref >39.00)
NonHDL: 109.3
Total CHOL/HDL Ratio: 3
Triglycerides: 216 mg/dL — ABNORMAL HIGH (ref 0.0–149.0)
VLDL: 43.2 mg/dL — ABNORMAL HIGH (ref 0.0–40.0)

## 2022-06-13 LAB — COMPREHENSIVE METABOLIC PANEL
ALT: 19 U/L (ref 0–53)
AST: 26 U/L (ref 0–37)
Albumin: 4.5 g/dL (ref 3.5–5.2)
Alkaline Phosphatase: 84 U/L (ref 39–117)
BUN: 11 mg/dL (ref 6–23)
CO2: 28 mEq/L (ref 19–32)
Calcium: 9.5 mg/dL (ref 8.4–10.5)
Chloride: 103 mEq/L (ref 96–112)
Creatinine, Ser: 0.82 mg/dL (ref 0.40–1.50)
GFR: 103.75 mL/min (ref >60.00)
Glucose, Bld: 128 mg/dL — ABNORMAL HIGH (ref 70–99)
Potassium: 4.5 mEq/L (ref 3.5–5.1)
Sodium: 141 mEq/L (ref 135–145)
Total Bilirubin: 0.3 mg/dL (ref 0.2–1.2)
Total Protein: 8.4 g/dL — ABNORMAL HIGH (ref 6.0–8.3)

## 2022-06-13 LAB — LDL CHOLESTEROL, DIRECT: Direct LDL: 74 mg/dL

## 2022-06-13 LAB — HEMOGLOBIN A1C: Hgb A1c MFr Bld: 7.3 % — ABNORMAL HIGH (ref 4.6–6.5)

## 2022-06-13 MED ORDER — AMLODIPINE BESYLATE 10 MG PO TABS: 10.0000 mg | ORAL_TABLET | Freq: Every day | ORAL | 1 refills | Status: DC

## 2022-06-13 NOTE — Patient Instructions (Signed)
Thanks for coming in today.  Continue to stay active with ideal goal of exercise most days per week, 150 minutes/week.  Walking is great exercise.  No medication changes for now.  If any concerns on labs I will let you know.  Follow-up in 6 months for physical.  Take care!

## 2022-06-13 NOTE — Assessment & Plan Note (Signed)
Stable, tolerating current regimen. Medications refilled recently. Labs pending as above.

## 2022-06-13 NOTE — Assessment & Plan Note (Signed)
Check A1c, continue exercise, try to avoid sugar containing beverages.  Down to 1 can of soda per day.  Recheck in 6 months, depending on A1c level.

## 2022-06-13 NOTE — Assessment & Plan Note (Signed)
Stable, tolerating current regimen. Medications refilled. Labs pending as above.   

## 2022-06-13 NOTE — Progress Notes (Signed)
Subjective:  Patient ID: John Obrien, male    DOB: Nov 18, 1975  Age: 47 y.o. MRN: [id number]  CC:  Chief Complaint  Patient presents with   Hypertension    HPI John Obrien presents for   Hypertension: Amlodipine 10 mg daily.  History of hypokalemia.  HCTZ discontinued in the past.  Stable potassium in September. No new med side effects. Home readings: 120-128/60-72.  BP Readings from Last 3 Encounters:  06/13/22 128/66  12/12/21 138/88  09/11/21 136/72   Lab Results  Component Value Date   CREATININE 0.76 12/12/2021   Lab Results  Component Value Date   K 4.2 12/12/2021   Hyperlipidemia: Lipitor 20 mg daily.  LDL 134 last June. May have run out prior to last visit. Taking daily now without side effects, myalgias.   Fasting today.  Lab Results  Component Value Date   CHOL 231 (H) 09/11/2021   HDL 54.50 09/11/2021   LDLCALC 127 (H) 02/09/2020   LDLDIRECT 134.0 09/11/2021   TRIG 224.0 (H) 09/11/2021   CHOLHDL 4 09/11/2021   Lab Results  Component Value Date   ALT 16 09/11/2021   AST 29 09/11/2021   ALKPHOS 68 09/11/2021   BILITOT 0.4 09/11/2021   Diabetes: Diet controlled. He is on statin.  A1c's have been stable, improved compared to June 2022.  Weight has increased some since last year. Less exercise recently - walking 2 times per week, prior 3 per week.  1 soda per day. Can. Fast food - none  Wt Readings from Last 3 Encounters:  06/13/22 155 lb 3.2 oz (70.4 kg)  12/12/21 146 lb 12.8 oz (66.6 kg)  09/11/21 143 lb 3.2 oz (65 kg)   Microalbumin: Normal ratio September 11, 2021 Optho, foot exam, pneumovax:  Up-to-date Optho few months ago. Repeat in June.   Hep C screening Colon cancer screening: Screening options with colonoscopy versus Cologuard discussed. No FH of colon CA, no personal hx of polyp/bleeding. Discussed timing of repeat testing intervals if normal, as well as potential need for diagnostic Colonoscopy if positive Cologuard.  Understanding expressed, and chose Colonoscopy   Lab Results  Component Value Date   HGBA1C 6.4 12/12/2021   HGBA1C 6.3 09/11/2021   HGBA1C 7.2 (H) 09/06/2020   Lab Results  Component Value Date   MICROALBUR <0.7 09/11/2021   [place] 127 (H) 02/09/2020   CREATININE 0.76 12/12/2021       History Patient Active Problem List   Diagnosis Date Noted   Hyperlipidemia 06/13/2022   Diet-controlled diabetes mellitus ([place]) 06/13/2022   Murmur 02/28/2018   Essential hypertension 02/28/2018    Past Medical History:  Diagnosis Date   Hypertension       Review of Systems  Constitutional:  Negative for fatigue and unexpected weight change.  Eyes:  Negative for visual disturbance.  Respiratory:  Negative for cough, chest tightness and shortness of breath.   Cardiovascular:  Negative for chest pain, palpitations and leg swelling.  Gastrointestinal:  Negative for abdominal pain and blood in stool.  Neurological:  Negative for dizziness, light-headedness and headaches.     Objective:   Vitals:   06/13/22 0827  BP: 128/66  Pulse: 84  Temp: 97.8 F (36.6 C)  TempSrc: Temporal  SpO2: 100%  Weight: 155 lb 3.2 oz (70.4 kg)  Height: 5\' 3"  (1.6 m)     Physical Exam Vitals reviewed.  Constitutional:      Appearance: He is well-developed.  HENT:     Head:  Normocephalic and atraumatic.  Neck:     Vascular: No carotid bruit or JVD.  Cardiovascular:     Rate and Rhythm: Normal rate and regular rhythm.     Heart sounds: Normal heart sounds. No murmur heard. Pulmonary:     Effort: Pulmonary effort is normal.     Breath sounds: Normal breath sounds. No rales.  Musculoskeletal:     Right lower leg: No edema.     Left lower leg: No edema.  Skin:    General: Skin is warm and dry.  Neurological:     Mental Status: He is alert and oriented to person, place, and time.  Psychiatric:        Mood and Affect: Mood normal.        Assessment & Plan:  John Obrien is a  47 y.o. male . Essential hypertension Assessment & Plan:  Stable, tolerating current regimen. Medications refilled. Labs pending as above.    Orders: -     amLODIPine Besylate; Take 1 tablet (10 mg total) by mouth daily.  Dispense: 90 tablet; Refill: 1  Screen for colon cancer -     Ambulatory referral to Gastroenterology  Need for hepatitis C screening test -     Hepatitis C antibody  Hyperlipidemia, unspecified hyperlipidemia type Assessment & Plan:  Stable, tolerating current regimen. Medications refilled recently. Labs pending as above.    Orders: -     Lipid panel -     Comprehensive metabolic panel  Diet-controlled diabetes mellitus ([place]) Assessment & Plan: Check A1c, continue exercise, try to avoid sugar containing beverages.  Down to 1 can of soda per day.  Recheck in 6 months, depending on A1c level.  Orders: -     Hemoglobin A1c  Refer to gastroenterology for colonoscopy, hep C antibody ordered for hep C screening.  40-month follow-up for physical.  Patient Instructions  Thanks for coming in today.  Continue to stay active with ideal goal of exercise most days per week, 150 minutes/week.  Walking is great exercise.  No medication changes for now.  If any concerns on labs I will let you know.  Follow-up in 6 months for physical.  Take care!    Signed,   Merri Ray, MD [place] 06/13/22 8:57 AM

## 2022-06-16 LAB — HEPATITIS C ANTIBODY: Hepatitis C Ab: NONREACTIVE

## 2022-11-06 ENCOUNTER — Encounter (INDEPENDENT_AMBULATORY_CARE_PROVIDER_SITE_OTHER): Payer: Self-pay

## 2022-12-18 ENCOUNTER — Encounter: Payer: Self-pay | Admitting: Family Medicine

## 2022-12-18 ENCOUNTER — Ambulatory Visit (INDEPENDENT_AMBULATORY_CARE_PROVIDER_SITE_OTHER): Payer: BC Managed Care – PPO | Admitting: Family Medicine

## 2022-12-18 VITALS — BP 136/74 | HR 88 | Temp 97.9°F | Ht 65.0 in | Wt 141.4 lb

## 2022-12-18 DIAGNOSIS — E1165 Type 2 diabetes mellitus with hyperglycemia: Secondary | ICD-10-CM | POA: Diagnosis not present

## 2022-12-18 DIAGNOSIS — I10 Essential (primary) hypertension: Secondary | ICD-10-CM | POA: Diagnosis not present

## 2022-12-18 DIAGNOSIS — E785 Hyperlipidemia, unspecified: Secondary | ICD-10-CM

## 2022-12-18 DIAGNOSIS — Z0000 Encounter for general adult medical examination without abnormal findings: Secondary | ICD-10-CM

## 2022-12-18 DIAGNOSIS — Z1211 Encounter for screening for malignant neoplasm of colon: Secondary | ICD-10-CM

## 2022-12-18 LAB — MICROALBUMIN / CREATININE URINE RATIO
Creatinine,U: 88 mg/dL
Microalb Creat Ratio: 1.3 mg/g (ref 0.0–30.0)
Microalb, Ur: 1.1 mg/dL (ref 0.0–1.9)

## 2022-12-18 LAB — COMPREHENSIVE METABOLIC PANEL
ALT: 16 U/L (ref 0–53)
AST: 18 U/L (ref 0–37)
Albumin: 4.5 g/dL (ref 3.5–5.2)
Alkaline Phosphatase: 103 U/L (ref 39–117)
BUN: 9 mg/dL (ref 6–23)
CO2: 27 mEq/L (ref 19–32)
Calcium: 9.9 mg/dL (ref 8.4–10.5)
Chloride: 99 mEq/L (ref 96–112)
Creatinine, Ser: 0.88 mg/dL (ref 0.40–1.50)
GFR: 101.2 mL/min (ref >60.00)
Glucose, Bld: 323 mg/dL — ABNORMAL HIGH (ref 70–99)
Potassium: 3.8 mEq/L (ref 3.5–5.1)
Sodium: 136 mEq/L (ref 135–145)
Total Bilirubin: 0.3 mg/dL (ref 0.2–1.2)
Total Protein: 8.6 g/dL — ABNORMAL HIGH (ref 6.0–8.3)

## 2022-12-18 LAB — CBC
HCT: 31.2 % — ABNORMAL LOW (ref 39.0–52.0)
Hemoglobin: 9 g/dL — ABNORMAL LOW (ref 13.0–17.0)
MCHC: 28.8 g/dL — ABNORMAL LOW (ref 30.0–36.0)
MCV: 61 fl — ABNORMAL LOW (ref 78.0–100.0)
Platelets: 536 10*3/uL — ABNORMAL HIGH (ref 150.0–400.0)
RBC: 5.12 Mil/uL (ref 4.22–5.81)
RDW: 19.7 % — ABNORMAL HIGH (ref 11.5–15.5)
WBC: 7.2 10*3/uL (ref 4.0–10.5)

## 2022-12-18 LAB — LIPID PANEL
Cholesterol: 184 mg/dL (ref 0–200)
HDL: 54.6 mg/dL (ref >39.00)
LDL Cholesterol: 90 mg/dL (ref 0–99)
NonHDL: 129.26
Total CHOL/HDL Ratio: 3
Triglycerides: 196 mg/dL — ABNORMAL HIGH (ref 0.0–149.0)
VLDL: 39.2 mg/dL (ref 0.0–40.0)

## 2022-12-18 LAB — HEMOGLOBIN A1C: Hgb A1c MFr Bld: 14.3 % — ABNORMAL HIGH (ref 4.6–6.5)

## 2022-12-18 LAB — TSH: TSH: 0.88 u[IU]/mL (ref 0.35–5.50)

## 2022-12-18 MED ORDER — ATORVASTATIN CALCIUM 20 MG PO TABS
20.0000 mg | ORAL_TABLET | Freq: Every day | ORAL | 1 refills | Status: DC
Start: 2022-12-18 — End: 2023-04-17

## 2022-12-18 MED ORDER — AMLODIPINE BESYLATE 10 MG PO TABS
10.0000 mg | ORAL_TABLET | Freq: Every day | ORAL | 1 refills | Status: DC
Start: 2022-12-18 — End: 2023-04-17

## 2022-12-18 NOTE — Progress Notes (Addendum)
Subjective:  Patient ID: John Obrien, male    DOB: 1975/10/10  Age: 47 y.o. MRN: [id number]  CC:  Chief Complaint  Patient presents with   Annual Exam    HPI John Obrien presents for Annual Exam PCP, me  Doing well - feeling good, no health concerns.   Diabetes:  Diet controlled, slight bump in his A1c in March, previously had been well-controlled.  No new meds. Since last visit - more exercise. Riding bike 3 times per week and exercise after work daily - situps, pushups, jumping jacks.  Avoids sweet tea, sugar beverages. Some fruit juice.  Has lost 14 #  Microalbumin: normal June 2023 Optho, foot exam, pneumovax:  Optho - in July, new provider at [facility] on [address]. Retinopathy screen - none. He will schedule.    Wt Readings from Last 3 Encounters:  12/18/22 141 lb 6.4 oz (64.1 kg)  06/13/22 155 lb 3.2 oz (70.4 kg)  12/12/21 146 lb 12.8 oz (66.6 kg)     Lab Results  Component Value Date   HGBA1C 7.3 (H) 06/13/2022   HGBA1C 6.4 12/12/2021   HGBA1C 6.3 09/11/2021   Lab Results  Component Value Date   MICROALBUR <0.7 09/11/2021   LDLCALC 127 (H) 02/09/2020   CREATININE 0.82 06/13/2022   Hypertension: Amlodipine 10 mg daily, no new side effects.  Home readings: 139-140/70's.  BP Readings from Last 3 Encounters:  12/18/22 136/74  06/13/22 128/66  12/12/21 138/88   Lab Results  Component Value Date   CREATININE 0.82 06/13/2022   Hyperlipidemia: Lipitor 20 mg daily. No new myalgias.  Lab Results  Component Value Date   CHOL 157 06/13/2022   HDL 47.90 06/13/2022   LDLCALC 127 (H) 02/09/2020   LDLDIRECT 74.0 06/13/2022   TRIG 216.0 (H) 06/13/2022   CHOLHDL 3 06/13/2022   Lab Results  Component Value Date   ALT 19 06/13/2022   AST 26 06/13/2022   ALKPHOS 84 06/13/2022   BILITOT 0.3 06/13/2022           12/18/2022    8:27 AM 06/13/2022    8:26 AM 12/12/2021    8:24 AM 09/11/2021    1:02 PM 09/06/2020   10:53 AM  Depression  screen PHQ 2/9  Decreased Interest 0 0 0 0 0  Down, Depressed, Hopeless 0 0 0 0 0  PHQ - 2 Score 0 0 0 0 0  Altered sleeping 0 0 0  0  Tired, decreased energy 0 0 0  0  Change in appetite 0 0 0  0  Feeling bad or failure about yourself  0 0 0  0  Trouble concentrating 0 0 0  0  Moving slowly or fidgety/restless 0 0 0  0  Suicidal thoughts 0 0 0  0  PHQ-9 Score 0 0 0  0  Difficult doing work/chores  Not difficult at all       Health Maintenance  Topic Date Due   FOOT EXAM  Never done   OPHTHALMOLOGY EXAM  Never done   Colonoscopy  Never done   Diabetic kidney evaluation - Urine ACR  09/12/2022   COVID-19 Vaccine (3 - 2023-24 season) 11/23/2022   HEMOGLOBIN A1C  12/14/2022   Diabetic kidney evaluation - eGFR measurement  06/13/2023   DTaP/Tdap/Td (2 - Td or Tdap) 01/20/2027   Hepatitis C Screening  Completed   HIV Screening  Completed   HPV VACCINES  Aged Out   INFLUENZA VACCINE  Discontinued  Screening options with colonoscopy versus Cologuard discussed. Discussed timing of repeat testing intervals if normal, as well as potential need for diagnostic Colonoscopy if positive Cologuard. Understanding expressed, and chose Cologuard.  Declined prior referral for colonoscopy.  No FH of colon CA, or personal hx of polyps/bleeding.  Prostate: does not have family history of prostate cancer The natural history of prostate cancer and ongoing controversy regarding screening and potential treatment outcomes of prostate cancer has been discussed with the patient. The meaning of a false positive PSA and a false negative PSA has been discussed. He indicates understanding of the limitations of this screening test and wishes NOT to proceed with screening PSA testing. No results found for: "PSA1", "PSA"    Immunization History  Administered Date(s) Administered   PFIZER(Purple Top)SARS-COV-2 Vaccination 09/13/2019, 10/05/2019   Tdap 01/19/2017  Flu vaccine - declines.  Covid booster -  declines.   No results found. Wears contacts, appt in July - due for retinopathy screen.   Dental:every 6 months. Recent visit.   Alcohol:  10 per week on average   Tobacco: none  Exercise:as above.    History Patient Active Problem List   Diagnosis Date Noted   Hyperlipidemia 06/13/2022   Diet-controlled diabetes mellitus (HCC) 06/13/2022   Past Medical History:  Diagnosis Date   Hypertension    No past surgical history on file. No Known Allergies Prior to Admission medications   Medication Sig Start Date End Date Taking? Authorizing Provider  amLODipine (NORVASC) 10 MG tablet Take 1 tablet (10 mg total) by mouth daily. 06/13/22  Yes Shade Flood, MD  atorvastatin (LIPITOR) 20 MG tablet TAKE 1 TABLET BY MOUTH EVERY DAY 06/02/22  Yes Shade Flood, MD   Social History   Socioeconomic History   Marital status: Single    Spouse name: Not on file   Number of children: Not on file   Years of education: Not on file   Highest education level: GED or equivalent  Occupational History   Not on file  Tobacco Use   Smoking status: Never   Smokeless tobacco: Never  Vaping Use   Vaping status: Never Used  Substance and Sexual Activity   Alcohol use: Yes    Alcohol/week: 3.0 standard drinks of alcohol    Types: 3 Shots of liquor per week   Drug use: Never   Sexual activity: Yes  Other Topics Concern   Not on file  Social History Narrative   Not on file   Social Determinants of Health   Financial Resource Strain: Low Risk  (06/12/2022)   Overall Financial Resource Strain (CARDIA)    Difficulty of Paying Living Expenses: Not hard at all  Food Insecurity: No Food Insecurity (06/12/2022)   Hunger Vital Sign    Worried About Running Out of Food in the Last Year: Never true    Ran Out of Food in the Last Year: Never true  Transportation Needs: No Transportation Needs (06/12/2022)   PRAPARE - Administrator, Civil Service (Medical): No    Lack of  Transportation (Non-Medical): No  Physical Activity: Insufficiently Active (06/12/2022)   Exercise Vital Sign    Days of Exercise per Week: 3 days    Minutes of Exercise per Session: 20 min  Stress: No Stress Concern Present (06/12/2022)   [employer] of Occupational Health - Occupational Stress Questionnaire    Feeling of Stress : Not at all  Social Connections: Moderately Isolated (06/12/2022)   Social  Connection and Isolation Panel [NHANES]    Frequency of Communication with Friends and Family: Twice a week    Frequency of Social Gatherings with Friends and Family: Once a week    Attends Religious Services: 1 to 4 times per year    Active Member of [employer] or Organizations: No    Attends Engineer, structural: Not on file    Marital Status: Never married  Intimate Partner Violence: Not on file    Review of Systems  13 point review of systems per patient health survey noted.  Negative other than as indicated above or in HPI.   Objective:   Vitals:   12/18/22 0816  BP: 136/74  Pulse: 88  Temp: 97.9 F (36.6 C)  TempSrc: Temporal  SpO2: 99%  Weight: 141 lb 6.4 oz (64.1 kg)  Height: 5\' 5"  (1.651 m)     Physical Exam Vitals reviewed.  Constitutional:      Appearance: He is well-developed.  HENT:     Head: Normocephalic and atraumatic.     Right Ear: External ear normal.     Left Ear: External ear normal.  Eyes:     Conjunctiva/sclera: Conjunctivae normal.     Pupils: Pupils are equal, round, and reactive to light.  Neck:     Thyroid: No thyromegaly.  Cardiovascular:     Rate and Rhythm: Normal rate and regular rhythm.     Heart sounds: Normal heart sounds.  Pulmonary:     Effort: Pulmonary effort is normal. No respiratory distress.     Breath sounds: Normal breath sounds. No wheezing.  Abdominal:     General: There is no distension.     Palpations: Abdomen is soft.     Tenderness: There is no abdominal tenderness.  Musculoskeletal:        General:  No tenderness. Normal range of motion.     Cervical back: Normal range of motion and neck supple.  Lymphadenopathy:     Cervical: No cervical adenopathy.  Skin:    General: Skin is warm and dry.  Neurological:     Mental Status: He is alert and oriented to person, place, and time.     Deep Tendon Reflexes: Reflexes are normal and symmetric.  Psychiatric:        Behavior: Behavior normal.        Assessment & Plan:  John Obrien is a 47 y.o. male . Annual physical exam - Plan: Lipid panel, Comprehensive metabolic panel, Hemoglobin A1c, TSH, Microalbumin / creatinine urine ratio  - -anticipatory guidance as below in AVS, screening labs above. Health maintenance items as above in HPI discussed/recommended as applicable.   Hyperlipidemia, unspecified hyperlipidemia type - Plan: Lipid panel, Comprehensive metabolic panel, atorvastatin (LIPITOR) 20 MG tablet  -Check labs, continue same med regimen.  Essential hypertension - Plan: Comprehensive metabolic panel, TSH, CBC, amLODipine (NORVASC) 10 MG tablet  -Stable, continue amlodipine, commended on diet, exercise changes.  Borderline elevated at home but RTC precautions given if persistent elevated readings.  Check labs.  Type 2 diabetes mellitus with hyperglycemia, without long-term current use of insulin (HCC) - Plan: Hemoglobin A1c, Microalbumin / creatinine urine ratio  -Anticipate improved A1c with weight loss, healthy lifestyle.  No new meds for now.  Adjust plan accordingly based on lab results  Screen for colon cancer - Plan: Cologuard   Meds ordered this encounter  Medications   amLODipine (NORVASC) 10 MG tablet    Sig: Take 1 tablet (10 mg total) by  mouth daily.    Dispense:  90 tablet    Refill:  1   atorvastatin (LIPITOR) 20 MG tablet    Sig: Take 1 tablet (20 mg total) by mouth daily.    Dispense:  90 tablet    Refill:  1   Patient Instructions  Call your eye specialist - schedule "diabetes retinopathy screening"  and have them send me that report. Keep up the good work with exercise. Congrats on the weight loss!  No medication changes at this time.  Suspect your labs will look much better with your weight loss and healthier lifestyle.  Again keep up the good work.  I did order the Cologuard colon cancer screening test, that should arrive to your home shortly.  Recheck in 6 months but please let me know if there are questions in the meantime.  Take care.   Preventive Care 72-63 Years Old, Male Preventive care refers to lifestyle choices and visits with your health care provider that can promote health and wellness. Preventive care visits are also called wellness exams. What can I expect for my preventive care visit? Counseling During your preventive care visit, your health care provider may ask about your: Medical history, including: Past medical problems. Family medical history. Current health, including: Emotional well-being. Home life and relationship well-being. Sexual activity. Lifestyle, including: Alcohol, nicotine or tobacco, and drug use. Access to firearms. Diet, exercise, and sleep habits. Safety issues such as seatbelt and bike helmet use. Sunscreen use. Work and work Astronomer. Physical exam Your health care provider will check your: Height and weight. These may be used to calculate your BMI (body mass index). BMI is a measurement that tells if you are at a healthy weight. Waist circumference. This measures the distance around your waistline. This measurement also tells if you are at a healthy weight and may help predict your risk of certain diseases, such as type 2 diabetes and high blood pressure. Heart rate and blood pressure. Body temperature. Skin for abnormal spots. What immunizations do I need?  Vaccines are usually given at various ages, according to a schedule. Your health care provider will recommend vaccines for you based on your age, medical history, and lifestyle  or other factors, such as travel or where you work. What tests do I need? Screening Your health care provider may recommend screening tests for certain conditions. This may include: Lipid and cholesterol levels. Diabetes screening. This is done by checking your blood sugar (glucose) after you have not eaten for a while (fasting). Hepatitis B test. Hepatitis C test. HIV (human immunodeficiency virus) test. STI (sexually transmitted infection) testing, if you are at risk. Lung cancer screening. Prostate cancer screening. Colorectal cancer screening. Talk with your health care provider about your test results, treatment options, and if necessary, the need for more tests. Follow these instructions at home: Eating and drinking  Eat a diet that includes fresh fruits and vegetables, whole grains, lean protein, and low-fat dairy products. Take vitamin and mineral supplements as recommended by your health care provider. Do not drink alcohol if your health care provider tells you not to drink. If you drink alcohol: Limit how much you have to 0-2 drinks a day. Know how much alcohol is in your drink. In the [place] one drink equals one 12 oz bottle of beer (355 mL), one 5 oz glass of wine (148 mL), or one 1 oz glass of hard liquor (44 mL). Lifestyle Brush your teeth every morning and night with fluoride toothpaste.  Floss one time each day. Exercise for at least 30 minutes 5 or more days each week. Do not use any products that contain nicotine or tobacco. These products include cigarettes, chewing tobacco, and vaping devices, such as e-cigarettes. If you need help quitting, ask your health care provider. Do not use drugs. If you are sexually active, practice safe sex. Use a condom or other form of protection to prevent STIs. Take aspirin only as told by your health care provider. Make sure that you understand how much to take and what form to take. Work with your health care provider to find out  whether it is safe and beneficial for you to take aspirin daily. Find healthy ways to manage stress, such as: Meditation, yoga, or listening to music. Journaling. Talking to a trusted person. Spending time with friends and family. Minimize exposure to UV radiation to reduce your risk of skin cancer. Safety Always wear your seat belt while driving or riding in a vehicle. Do not drive: If you have been drinking alcohol. Do not ride with someone who has been drinking. When you are tired or distracted. While texting. If you have been using any mind-altering substances or drugs. Wear a helmet and other protective equipment during sports activities. If you have firearms in your house, make sure you follow all gun safety procedures. What's next? Go to your health care provider once a year for an annual wellness visit. Ask your health care provider how often you should have your eyes and teeth checked. Stay up to date on all vaccines. This information is not intended to replace advice given to you by your health care provider. Make sure you discuss any questions you have with your health care provider. Document Revised: 09/05/2020 Document Reviewed: 09/05/2020 Elsevier Patient Education  2024 Elsevier Inc.        Signed,   Meredith Staggers, MD [facility], [facility] 12/18/22 9:13 AM

## 2022-12-18 NOTE — Patient Instructions (Addendum)
Call your eye specialist - schedule "diabetes retinopathy screening" and have them send me that report. Keep up the good work with exercise. Congrats on the weight loss!  No medication changes at this time.  Suspect your labs will look much better with your weight loss and healthier lifestyle.  Again keep up the good work.  I did order the Cologuard colon cancer screening test, that should arrive to your home shortly.  Recheck in 6 months but please let me know if there are questions in the meantime.  Take care.   Preventive Care 57-63 Years Old, Male Preventive care refers to lifestyle choices and visits with your health care provider that can promote health and wellness. Preventive care visits are also called wellness exams. What can I expect for my preventive care visit? Counseling During your preventive care visit, your health care provider may ask about your: Medical history, including: Past medical problems. Family medical history. Current health, including: Emotional well-being. Home life and relationship well-being. Sexual activity. Lifestyle, including: Alcohol, nicotine or tobacco, and drug use. Access to firearms. Diet, exercise, and sleep habits. Safety issues such as seatbelt and bike helmet use. Sunscreen use. Work and work Astronomer. Physical exam Your health care provider will check your: Height and weight. These may be used to calculate your BMI (body mass index). BMI is a measurement that tells if you are at a healthy weight. Waist circumference. This measures the distance around your waistline. This measurement also tells if you are at a healthy weight and may help predict your risk of certain diseases, such as type 2 diabetes and high blood pressure. Heart rate and blood pressure. Body temperature. Skin for abnormal spots. What immunizations do I need?  Vaccines are usually given at various ages, according to a schedule. Your health care provider will  recommend vaccines for you based on your age, medical history, and lifestyle or other factors, such as travel or where you work. What tests do I need? Screening Your health care provider may recommend screening tests for certain conditions. This may include: Lipid and cholesterol levels. Diabetes screening. This is done by checking your blood sugar (glucose) after you have not eaten for a while (fasting). Hepatitis B test. Hepatitis C test. HIV (human immunodeficiency virus) test. STI (sexually transmitted infection) testing, if you are at risk. Lung cancer screening. Prostate cancer screening. Colorectal cancer screening. Talk with your health care provider about your test results, treatment options, and if necessary, the need for more tests. Follow these instructions at home: Eating and drinking  Eat a diet that includes fresh fruits and vegetables, whole grains, lean protein, and low-fat dairy products. Take vitamin and mineral supplements as recommended by your health care provider. Do not drink alcohol if your health care provider tells you not to drink. If you drink alcohol: Limit how much you have to 0-2 drinks a day. Know how much alcohol is in your drink. In the [place] one drink equals one 12 oz bottle of beer (355 mL), one 5 oz glass of wine (148 mL), or one 1 oz glass of hard liquor (44 mL). Lifestyle Brush your teeth every morning and night with fluoride toothpaste. Floss one time each day. Exercise for at least 30 minutes 5 or more days each week. Do not use any products that contain nicotine or tobacco. These products include cigarettes, chewing tobacco, and vaping devices, such as e-cigarettes. If you need help quitting, ask your health care provider. Do not use drugs. If  you are sexually active, practice safe sex. Use a condom or other form of protection to prevent STIs. Take aspirin only as told by your health care provider. Make sure that you understand how much to take  and what form to take. Work with your health care provider to find out whether it is safe and beneficial for you to take aspirin daily. Find healthy ways to manage stress, such as: Meditation, yoga, or listening to music. Journaling. Talking to a trusted person. Spending time with friends and family. Minimize exposure to UV radiation to reduce your risk of skin cancer. Safety Always wear your seat belt while driving or riding in a vehicle. Do not drive: If you have been drinking alcohol. Do not ride with someone who has been drinking. When you are tired or distracted. While texting. If you have been using any mind-altering substances or drugs. Wear a helmet and other protective equipment during sports activities. If you have firearms in your house, make sure you follow all gun safety procedures. What's next? Go to your health care provider once a year for an annual wellness visit. Ask your health care provider how often you should have your eyes and teeth checked. Stay up to date on all vaccines. This information is not intended to replace advice given to you by your health care provider. Make sure you discuss any questions you have with your health care provider. Document Revised: 09/05/2020 Document Reviewed: 09/05/2020 Elsevier Patient Education  2024 [employer].

## 2022-12-23 ENCOUNTER — Telehealth: Payer: Self-pay

## 2022-12-23 NOTE — Telephone Encounter (Signed)
That is too long to wait - we may need to start insulin and need to make sure hemoglobin is not decreasing or sign of bleeding. Can provide note for work if needed. Should be seen this week if possible.

## 2022-12-23 NOTE — Telephone Encounter (Signed)
Per lab result note: please call and schedule visit with me within the next week    Patient notes he cannot come in before the end of the month due to work schedule. I have him scheduled for 01/19/23 but notes he doesn't have the option to come sooner   Please advise if there are any additional recommendations

## 2022-12-23 NOTE — Telephone Encounter (Signed)
Noted, thanks!

## 2022-12-23 NOTE — Telephone Encounter (Signed)
Called patient back and he is going to see if he can arrange to come in Monday but that is the soonest he can do

## 2022-12-29 ENCOUNTER — Ambulatory Visit: Payer: BC Managed Care – PPO | Admitting: Family Medicine

## 2022-12-31 ENCOUNTER — Encounter: Payer: Self-pay | Admitting: Family Medicine

## 2022-12-31 ENCOUNTER — Ambulatory Visit: Payer: BC Managed Care – PPO | Admitting: Family Medicine

## 2022-12-31 VITALS — BP 114/74 | HR 75 | Temp 97.9°F | Wt 143.6 lb

## 2022-12-31 DIAGNOSIS — D509 Iron deficiency anemia, unspecified: Secondary | ICD-10-CM

## 2022-12-31 DIAGNOSIS — Z794 Long term (current) use of insulin: Secondary | ICD-10-CM

## 2022-12-31 DIAGNOSIS — E1165 Type 2 diabetes mellitus with hyperglycemia: Secondary | ICD-10-CM | POA: Diagnosis not present

## 2022-12-31 DIAGNOSIS — Z7984 Long term (current) use of oral hypoglycemic drugs: Secondary | ICD-10-CM

## 2022-12-31 LAB — CBC
HCT: 33.2 % — ABNORMAL LOW (ref 39.0–52.0)
Hemoglobin: 9.4 g/dL — ABNORMAL LOW (ref 13.0–17.0)
MCHC: 28.4 g/dL — ABNORMAL LOW (ref 30.0–36.0)
MCV: 62.5 fL — ABNORMAL LOW (ref 78.0–100.0)
Platelets: 756 10*3/uL — ABNORMAL HIGH (ref 150.0–400.0)
RBC: 5.32 Mil/uL (ref 4.22–5.81)
RDW: 19.6 % — ABNORMAL HIGH (ref 11.5–15.5)
WBC: 8.1 10*3/uL (ref 4.0–10.5)

## 2022-12-31 LAB — BASIC METABOLIC PANEL
BUN: 7 mg/dL (ref 6–23)
CO2: 28 meq/L (ref 19–32)
Calcium: 10.6 mg/dL — ABNORMAL HIGH (ref 8.4–10.5)
Chloride: 100 meq/L (ref 96–112)
Creatinine, Ser: 0.76 mg/dL (ref 0.40–1.50)
GFR: 105.75 mL/min (ref >60.00)
Glucose, Bld: 241 mg/dL — ABNORMAL HIGH (ref 70–99)
Potassium: 4.6 meq/L (ref 3.5–5.1)
Sodium: 137 meq/L (ref 135–145)

## 2022-12-31 LAB — GLUCOSE, POCT (MANUAL RESULT ENTRY): POC Glucose: 231 mg/dL — AB (ref 70–99)

## 2022-12-31 MED ORDER — INSULIN PEN NEEDLE 30G X 8 MM MISC
1.0000 | 1 refills | Status: DC | PRN
Start: 2022-12-31 — End: 2023-07-27

## 2022-12-31 MED ORDER — LANTUS SOLOSTAR 100 UNIT/ML ~~LOC~~ SOPN
10.0000 [IU] | PEN_INJECTOR | Freq: Every day | SUBCUTANEOUS | 99 refills | Status: DC
Start: 2022-12-31 — End: 2023-08-12

## 2022-12-31 MED ORDER — METFORMIN HCL 500 MG PO TABS
500.0000 mg | ORAL_TABLET | Freq: Two times a day (BID) | ORAL | 1 refills | Status: DC
Start: 2022-12-31 — End: 2023-04-17

## 2022-12-31 NOTE — Progress Notes (Signed)
Subjective:  Patient ID: John Obrien, male    DOB: 09-Feb-1976  Age: 47 y.o. MRN: [id number]  CC:  Chief Complaint  Patient presents with   Medical Management of Chronic Issues    No further questions/concerns.     HPI John Obrien presents for  Follow-up of abnormal blood work, last seen for physical on September 26.  Diabetes: Previous diet control, slight increase in A1c from September of last year to March of this year but then significant increase recently with A1c of 14.3, glucose 323 last visit.  Normal bicarb, potassium and sodium.  Urine microalbumin also normal.  He has been trying to adjust diet with avoiding sugar containing drinks.  Triglycerides slightly elevated, otherwise lipids looked okay.  Likely related to hyperglycemia.  Some blurry vision with reading past few weeks, increased thirst and urinary frequency past few weeks. No abd pain, nausea, vomiting or new fatigue.  No blood sugar meter.   Lab Results  Component Value Date   HGBA1C 14.3 (H) 12/18/2022   HGBA1C 7.3 (H) 06/13/2022   HGBA1C 6.4 12/12/2021   Lab Results  Component Value Date   MICROALBUR 1.1 12/18/2022   LDLCALC 90 12/18/2022   CREATININE 0.88 12/18/2022   Anemia with thrombocytosis Noted on September 26 labs with hemoglobin of 9.0, platelets 536.  Microcytic anemia.  No recent CBC available for review.  Point-of-care CBC from 2014 in [facility] with hemoglobin 8.1 at that time. No recent colonoscopy, but Cologuard was ordered last visit - has not yet completed.  Reports prior hx of anemia - unknown cause. No transfusion was required. Possible iron deficiency, treated with iron pills.  No dark stools - denies melena/hematochezia.  No lightheadedness or dizziness.    History Patient Active Problem List   Diagnosis Date Noted   Hyperlipidemia 06/13/2022   Diet-controlled diabetes mellitus (HCC) 06/13/2022   Past Medical History:  Diagnosis Date   Hypertension     History reviewed. No pertinent surgical history. No Known Allergies Prior to Admission medications   Medication Sig Start Date End Date Taking? Authorizing Provider  amLODipine (NORVASC) 10 MG tablet Take 1 tablet (10 mg total) by mouth daily. 12/18/22  Yes Shade Flood, MD  atorvastatin (LIPITOR) 20 MG tablet Take 1 tablet (20 mg total) by mouth daily. 12/18/22  Yes Shade Flood, MD   Social History   Socioeconomic History   Marital status: Single    Spouse name: Not on file   Number of children: Not on file   Years of education: Not on file   Highest education level: GED or equivalent  Occupational History   Not on file  Tobacco Use   Smoking status: Never   Smokeless tobacco: Never  Vaping Use   Vaping status: Never Used  Substance and Sexual Activity   Alcohol use: Yes    Alcohol/week: 3.0 standard drinks of alcohol    Types: 3 Shots of liquor per week   Drug use: Never   Sexual activity: Yes  Other Topics Concern   Not on file  Social History Narrative   Not on file   Social Determinants of Health   Financial Resource Strain: Low Risk  (06/12/2022)   Overall Financial Resource Strain (CARDIA)    Difficulty of Paying Living Expenses: Not hard at all  Food Insecurity: No Food Insecurity (06/12/2022)   Hunger Vital Sign    Worried About Running Out of Food in the Last Year: Never true  Ran Out of Food in the Last Year: Never true  Transportation Needs: No Transportation Needs (06/12/2022)   PRAPARE - Administrator, Civil Service (Medical): No    Lack of Transportation (Non-Medical): No  Physical Activity: Insufficiently Active (06/12/2022)   Exercise Vital Sign    Days of Exercise per Week: 3 days    Minutes of Exercise per Session: 20 min  Stress: No Stress Concern Present (06/12/2022)   [employer] of Occupational Health - Occupational Stress Questionnaire    Feeling of Stress : Not at all  Social Connections: Moderately Isolated  (06/12/2022)   Social Connection and Isolation Panel [NHANES]    Frequency of Communication with Friends and Family: Twice a week    Frequency of Social Gatherings with Friends and Family: Once a week    Attends Religious Services: 1 to 4 times per year    Active Member of [employer] or Organizations: No    Attends Engineer, structural: Not on file    Marital Status: Never married  Intimate Partner Violence: Not on file    Review of Systems Per HPI.   Objective:   Vitals:   12/31/22 0944  BP: 114/74  Pulse: 75  Temp: 97.9 F (36.6 C)  SpO2: 99%  Weight: 143 lb 9.6 oz (65.1 kg)     Physical Exam Vitals reviewed.  Constitutional:      Appearance: He is well-developed.  HENT:     Head: Normocephalic and atraumatic.  Neck:     Vascular: No carotid bruit or JVD.  Cardiovascular:     Rate and Rhythm: Normal rate and regular rhythm.     Heart sounds: Normal heart sounds. No murmur heard. Pulmonary:     Effort: Pulmonary effort is normal.     Breath sounds: Normal breath sounds. No rales.  Musculoskeletal:     Right lower leg: No edema.     Left lower leg: No edema.  Skin:    General: Skin is warm and dry.  Neurological:     Mental Status: He is alert and oriented to person, place, and time.  Psychiatric:        Mood and Affect: Mood normal.       Results for orders placed or performed in visit on 12/31/22  POCT glucose (manual entry)  Result Value Ref Range   POC Glucose 231 (A) 70 - 99 mg/dl  Hemocult negative.   54 minutes spent during visit, including chart review, counseling and assimilation of information, exam, discussion of plan.  Discussion of medications, including teaching for use of injectable insulin, and chart completion.    Assessment & Plan:  John Obrien is a 47 y.o. male . Microcytic anemia - Plan: Iron, TIBC and Ferritin Panel, IFOBT POC (occult bld, rslt in office), CBC, insulin glargine (LANTUS SOLOSTAR) 100 UNIT/ML Solostar Pen,  Insulin Pen Needle (NOVOFINE) 30G X 8 MM MISC, metFORMIN (GLUCOPHAGE) 500 MG tablet -Heme-negative stool in office, check iron, CBC, continue hematology follow-up depending on results.  Type 2 diabetes mellitus with hyperglycemia, without long-term current use of insulin (HCC) - Plan: Basic metabolic panel, POCT glucose (manual entry)  -Prior diet control, now significant hyperglycemia, will initially treat to minimize glucose toxicity.  Appears stable for outpatient treatment at this time.  Start Lantus 10 units/day, teaching with demonstration device in office with understanding expressed.  Increase by 2 units every 2 days until under 200 but will be rechecking in 1 week.  Start low-dose  metformin with potential side effects discussed.  Sample of freestyle libre continuous glucose monitor provided for now.  ER/RTC precautions given.  Meds ordered this encounter  Medications   insulin glargine (LANTUS SOLOSTAR) 100 UNIT/ML Solostar Pen    Sig: Inject 10 Units into the skin daily.    Dispense:  15 mL    Refill:  PRN   Insulin Pen Needle (NOVOFINE) 30G X 8 MM MISC    Sig: Inject 10 each into the skin as needed.    Dispense:  90 each    Refill:  1   metFORMIN (GLUCOPHAGE) 500 MG tablet    Sig: Take 1 tablet (500 mg total) by mouth 2 (two) times daily with a meal. Ok to start every day for few days, then increase to BID if tolerated.    Dispense:  180 tablet    Refill:  1   Patient Instructions   See info below on using the insulin. Start once per day. If blood sugar is remaining over 200 in 2 days, then increase insulin to 12 units per day. Can increase another 2 units in 2 more days of still remaining over 200. Once readings are under 200 stay at same dose. Recheck in 1 week. Hang in there.    I will recheck blood counts today and iron levels to decide on next step.  No sign of blood in the stool today  Return to the clinic or go to the nearest emergency room if any of your symptoms worsen  or new symptoms occur.  Insulin Injection Instructions, Using Insulin Pens, Adult There are many different types of insulin. The type of insulin that you take may determine how many injections you give yourself and when you need to give the injections. Supplies needed: Soap and water. Your insulin pen. A new needle. Alcohol wipes. A disposal container for sharp items (sharps container), such as an empty plastic bottle with a cover. How to choose a site for injection The body absorbs insulin differently, depending on where the insulin is injected (injection site). It is best to inject insulin into the same body area each time; for example, always in the abdomen. However, you should use a different spot in that area for each injection. Do not inject the insulin in the same spot each time. There are five main areas that can be used for injecting. These areas are: Abdomen. This is the preferred area. Front of thigh. Upper, outer side of thigh. Upper, outer side of arm. Upper, outer part of buttock. How to use an insulin pen  Get ready Wash your hands with soap and water for at least 20 seconds. If soap and water are not available, use hand sanitizer. Test your blood sugar (glucose) level and write down that number. Follow any instructions from your health care provider about what to do if your blood glucose level is higher or lower than your normal range. Make sure the insulin pen has the right kind of insulin and there is enough insulin in the pen for the dose. Check the expiration date. If you are using CLEAR insulin, check to see that it is clear and free of clumps. If you are using CLOUDY insulin, mix it by gently rolling the insulin pen between your palms several times. Do not shake the pen. Remove the cap from the insulin pen. Use an alcohol wipe to clean the rubber tip of the pen. Remove the protective paper tab from the disposable needle. Do not let  the needle touch anything. Screw a  new, unused needle onto the pen. Remove the outer plastic needle cover. Do not throw away the outer plastic cover yet. If the pen uses a special safety needle, leave the inner needle shield in place. If the pen does not use a special safety needle, remove the inner plastic cover from the needle. If you skip this step, you may not get the right amount of insulin. Follow the manufacturer's instructions to prime the insulin pen with the volume of insulin needed. Hold the pen with the needle pointing up, and push the button on the opposite end of the pen until a drop of insulin appears at the needle tip. If no insulin appears, repeat this step. Turn the button (dial) to the number of units of insulin that you will be injecting. Inject the insulin Use an alcohol wipe to clean the site where you will be inserting the needle. Let the site air-dry. Grip the base of the pen with a loose fist and rest your thumb on the pen or hold the pen in the palm of your writing hand like a pencil. If directed by your health care provider, use your other hand to pinch and hold about 1 inch (2.5 cm) of skin at the injection site. Do not directly touch the cleaned part of the skin. Gently but quickly, use your writing hand to put the needle straight into the skin. Insert the needle at a 45-degree angle or a 90-degree angle (perpendicular) to the skin, as directed by your health care provider. When the needle is completely inserted into the skin, let go of the skin that you are pinching. Use your thumb or index finger of your writing hand to push the top button of the pen all the way to inject the insulin. Continue to hold the pen in place with your writing hand. Wait 10 seconds, then pull the needle straight out of the skin. This will allow all of the insulin to go from the pen and needle into your body. Carefully put the outer plastic cover of the needle back over the needle, then unscrew the capped needle and discard it in a  sharps container, such as an empty plastic bottle with a cover. Put the plastic cap back on the insulin pen. How to throw away supplies Discard all used needles in a sharps container. Follow the disposal regulations for the area where you live. Do not use any needle more than one time. Throw away empty disposable pens in the regular trash. Questions to ask your health care provider How often should I be taking insulin? How often should I check my blood glucose? What amount of insulin should I be taking each time? What are the side effects? What should I do if my blood glucose is too high? What should I do if my blood glucose is too low? What should I do if I forget to take my insulin? What number should I call if I have questions? Where to find more information American Diabetes Association (ADA): www.diabetes.org Association of Diabetes Care and Education Specialists (ADCES): www.diabeteseducator.org Summary Before you give yourself an insulin injection, be sure to wash your hands for at least 20 seconds and test your blood glucose level. Write down that number. Check the expiration date and the type of insulin that is in the pen. The type of insulin that you take may determine how many injections you give yourself and when you need to give the injections.  It is best to inject insulin into the same body area each time; for example, always in the abdomen. However, you should use a different spot in that area for each injection. Do not use a needle more than one time. This information is not intended to replace advice given to you by your health care provider. Make sure you discuss any questions you have with your health care provider. Document Revised: 05/28/2020 Document Reviewed: 01/07/2020 Elsevier Patient Education  2024 Elsevier Inc.     Signed,   Meredith Staggers, MD [facility], [facility] 12/31/22 10:51 AM

## 2022-12-31 NOTE — Patient Instructions (Addendum)
See info below on using the insulin. Start once per day. If blood sugar is remaining over 200 in 2 days, then increase insulin to 12 units per day. Can increase another 2 units in 2 more days of still remaining over 200. Once readings are under 200 stay at same dose. Recheck in 1 week. Hang in there.    I will recheck blood counts today and iron levels to decide on next step.  No sign of blood in the stool today  Return to the clinic or go to the nearest emergency room if any of your symptoms worsen or new symptoms occur.  Insulin Injection Instructions, Using Insulin Pens, Adult There are many different types of insulin. The type of insulin that you take may determine how many injections you give yourself and when you need to give the injections. Supplies needed: Soap and water. Your insulin pen. A new needle. Alcohol wipes. A disposal container for sharp items (sharps container), such as an empty plastic bottle with a cover. How to choose a site for injection The body absorbs insulin differently, depending on where the insulin is injected (injection site). It is best to inject insulin into the same body area each time; for example, always in the abdomen. However, you should use a different spot in that area for each injection. Do not inject the insulin in the same spot each time. There are five main areas that can be used for injecting. These areas are: Abdomen. This is the preferred area. Front of thigh. Upper, outer side of thigh. Upper, outer side of arm. Upper, outer part of buttock. How to use an insulin pen  Get ready Wash your hands with soap and water for at least 20 seconds. If soap and water are not available, use hand sanitizer. Test your blood sugar (glucose) level and write down that number. Follow any instructions from your health care provider about what to do if your blood glucose level is higher or lower than your normal range. Make sure the insulin pen has the right  kind of insulin and there is enough insulin in the pen for the dose. Check the expiration date. If you are using CLEAR insulin, check to see that it is clear and free of clumps. If you are using CLOUDY insulin, mix it by gently rolling the insulin pen between your palms several times. Do not shake the pen. Remove the cap from the insulin pen. Use an alcohol wipe to clean the rubber tip of the pen. Remove the protective paper tab from the disposable needle. Do not let the needle touch anything. Screw a new, unused needle onto the pen. Remove the outer plastic needle cover. Do not throw away the outer plastic cover yet. If the pen uses a special safety needle, leave the inner needle shield in place. If the pen does not use a special safety needle, remove the inner plastic cover from the needle. If you skip this step, you may not get the right amount of insulin. Follow the manufacturer's instructions to prime the insulin pen with the volume of insulin needed. Hold the pen with the needle pointing up, and push the button on the opposite end of the pen until a drop of insulin appears at the needle tip. If no insulin appears, repeat this step. Turn the button (dial) to the number of units of insulin that you will be injecting. Inject the insulin Use an alcohol wipe to clean the site where you will be inserting  the needle. Let the site air-dry. Grip the base of the pen with a loose fist and rest your thumb on the pen or hold the pen in the palm of your writing hand like a pencil. If directed by your health care provider, use your other hand to pinch and hold about 1 inch (2.5 cm) of skin at the injection site. Do not directly touch the cleaned part of the skin. Gently but quickly, use your writing hand to put the needle straight into the skin. Insert the needle at a 45-degree angle or a 90-degree angle (perpendicular) to the skin, as directed by your health care provider. When the needle is completely  inserted into the skin, let go of the skin that you are pinching. Use your thumb or index finger of your writing hand to push the top button of the pen all the way to inject the insulin. Continue to hold the pen in place with your writing hand. Wait 10 seconds, then pull the needle straight out of the skin. This will allow all of the insulin to go from the pen and needle into your body. Carefully put the outer plastic cover of the needle back over the needle, then unscrew the capped needle and discard it in a sharps container, such as an empty plastic bottle with a cover. Put the plastic cap back on the insulin pen. How to throw away supplies Discard all used needles in a sharps container. Follow the disposal regulations for the area where you live. Do not use any needle more than one time. Throw away empty disposable pens in the regular trash. Questions to ask your health care provider How often should I be taking insulin? How often should I check my blood glucose? What amount of insulin should I be taking each time? What are the side effects? What should I do if my blood glucose is too high? What should I do if my blood glucose is too low? What should I do if I forget to take my insulin? What number should I call if I have questions? Where to find more information American Diabetes Association (ADA): www.diabetes.org Association of Diabetes Care and Education Specialists (ADCES): www.diabeteseducator.org Summary Before you give yourself an insulin injection, be sure to wash your hands for at least 20 seconds and test your blood glucose level. Write down that number. Check the expiration date and the type of insulin that is in the pen. The type of insulin that you take may determine how many injections you give yourself and when you need to give the injections. It is best to inject insulin into the same body area each time; for example, always in the abdomen. However, you should use a  different spot in that area for each injection. Do not use a needle more than one time. This information is not intended to replace advice given to you by your health care provider. Make sure you discuss any questions you have with your health care provider. Document Revised: 05/28/2020 Document Reviewed: 01/07/2020 Elsevier Patient Education  2024 [employer].

## 2023-01-01 LAB — IRON,TIBC AND FERRITIN PANEL
%SAT: 4 % — ABNORMAL LOW (ref 20–48)
Ferritin: 2 ng/mL — ABNORMAL LOW (ref 38–380)
Iron: 21 ug/dL — ABNORMAL LOW (ref 50–180)
TIBC: 572 ug/dL — ABNORMAL HIGH (ref 250–425)

## 2023-01-02 ENCOUNTER — Telehealth: Payer: Self-pay

## 2023-01-02 ENCOUNTER — Other Ambulatory Visit: Payer: Self-pay | Admitting: Family Medicine

## 2023-01-02 DIAGNOSIS — D509 Iron deficiency anemia, unspecified: Secondary | ICD-10-CM

## 2023-01-02 MED ORDER — IRON (FERROUS SULFATE) 325 (65 FE) MG PO TABS
325.0000 mg | ORAL_TABLET | Freq: Every day | ORAL | 5 refills | Status: AC
Start: 2023-01-02 — End: ?

## 2023-01-02 NOTE — Telephone Encounter (Signed)
Patient called and stated that his lantus solostar pen and needles were not at the pharmacy. I called pharmacy to verify and tech stated that they do have the rx but the pen was needing a PA. Tech stated that they would cover the semglee but he would have a $90 copay. Gave verbal to change to what was covered. She stated they did not have any in stock but [facility] [place] on [address] did. Patient was informed that [facility] could [facility] [place] and [facility] and that the pharmacy should be able to pull [facility].

## 2023-01-02 NOTE — Progress Notes (Signed)
See labs, iron deficiency anemia, start iron supplement once per day for now

## 2023-01-02 NOTE — Telephone Encounter (Signed)
Noted. Thanks for working through this with pharmacy.

## 2023-01-19 ENCOUNTER — Ambulatory Visit: Payer: BC Managed Care – PPO | Admitting: Family Medicine

## 2023-01-19 VITALS — BP 138/70 | HR 89 | Temp 97.7°F | Ht 65.0 in | Wt 145.2 lb

## 2023-01-19 DIAGNOSIS — Z7984 Long term (current) use of oral hypoglycemic drugs: Secondary | ICD-10-CM | POA: Diagnosis not present

## 2023-01-19 DIAGNOSIS — E1165 Type 2 diabetes mellitus with hyperglycemia: Secondary | ICD-10-CM | POA: Diagnosis not present

## 2023-01-19 DIAGNOSIS — D509 Iron deficiency anemia, unspecified: Secondary | ICD-10-CM | POA: Diagnosis not present

## 2023-01-19 DIAGNOSIS — Z1211 Encounter for screening for malignant neoplasm of colon: Secondary | ICD-10-CM | POA: Diagnosis not present

## 2023-01-19 LAB — CBC
HCT: 33.5 % — ABNORMAL LOW (ref 39.0–52.0)
Hemoglobin: 9.5 g/dL — ABNORMAL LOW (ref 13.0–17.0)
MCHC: 28.3 g/dL — ABNORMAL LOW (ref 30.0–36.0)
MCV: 65.7 fL — ABNORMAL LOW (ref 78.0–100.0)
Platelets: 733 10*3/uL — ABNORMAL HIGH (ref 150.0–400.0)
RBC: 5.1 Mil/uL (ref 4.22–5.81)
RDW: 21.4 % — ABNORMAL HIGH (ref 11.5–15.5)
WBC: 9 10*3/uL (ref 4.0–10.5)

## 2023-01-19 LAB — BASIC METABOLIC PANEL
BUN: 13 mg/dL (ref 6–23)
CO2: 26 meq/L (ref 19–32)
Calcium: 9.6 mg/dL (ref 8.4–10.5)
Chloride: 103 meq/L (ref 96–112)
Creatinine, Ser: 0.76 mg/dL (ref 0.40–1.50)
GFR: 105.71 mL/min (ref >60.00)
Glucose, Bld: 131 mg/dL — ABNORMAL HIGH (ref 70–99)
Potassium: 4.3 meq/L (ref 3.5–5.1)
Sodium: 136 meq/L (ref 135–145)

## 2023-01-19 LAB — GLUCOSE, POCT (MANUAL RESULT ENTRY): POC Glucose: 134 mg/dL — AB (ref 70–99)

## 2023-01-19 MED ORDER — FREESTYLE LIBRE 3 SENSOR MISC: 1.0000 | 1 refills | Status: DC

## 2023-01-19 NOTE — Patient Instructions (Addendum)
I will refer you to hematology for the anemia, ok to increase iron to twice per day if tolerated.  Continue metformin same dose for now.  If any readings below 100 decrease your insulin to 8 units.  If any further lower readings on that dose, let me know and we can decrease that medication or hold it if needed.  Recheck in 6 weeks.  Be seen sooner if any new or worsening symptoms.  Take care!

## 2023-01-19 NOTE — Progress Notes (Signed)
Subjective:  Patient ID: John Obrien, male    DOB: 1975-07-11  Age: 47 y.o. MRN: [id number]  CC:  Chief Complaint  Patient presents with   Diabetes    Pt is well, no concerns    HPI John Obrien presents for   Diabetes: Prior diet control, slight increase in A1c in March then significantly increased to 14.3 on September 26.  Normal bicarb, potassium and sodium at that time but glucose was 323 in office that day.  Follow-up on October 9th.  Started on metformin 500mg  twice daily, Lantus 10 units initially once per day then increasing by 2 units until readings under 200. Currently on lantus 10 units per day - reading under 200 except for a few readings over 200.  Denies any significant side effects of metformin or Lantus.  Slight soft stools with metformin. No symptomatic lows. Lowest 69. Few hours after lunch.  Home readings fasting: 100-112 Home readings postprandial: 130-140  Microalbumin: nl 12/18/22.  Optho, foot exam, pneumovax: UTD.   Lab Results  Component Value Date   HGBA1C 14.3 (H) 12/18/2022   HGBA1C 7.3 (H) 06/13/2022   HGBA1C 6.4 12/12/2021   Lab Results  Component Value Date   MICROALBUR 1.1 12/18/2022   LDLCALC 90 12/18/2022   CREATININE 0.76 12/31/2022   Microcytic anemia Iron deficiency anemia.  Started on iron supplementation, recommended twice daily dosing.  Heme-negative stool in office last visit.  No melena/hematochezia. prior anemia noted through care everywhere in 2014.  Reports previous history of anemia but no transfusion in unknown cause.  Had not had recent colonoscopy but Cologuard was ordered.   Plans on completing cologuard this week.  No lightheadedness or dizziness.  Taking iron every day. No se's. No constipation.   Lab Results  Component Value Date   WBC 8.1 12/31/2022   HGB 9.4 (L) 12/31/2022   HCT 33.2 (L) 12/31/2022   MCV 62.5 Repeated and verified X2. (L) 12/31/2022   PLT 756.0 (H) 12/31/2022   Lab Results  Component  Value Date   IRON 21 (L) 12/31/2022   TIBC 572 (H) 12/31/2022   FERRITIN 2 (L) 12/31/2022    History Patient Active Problem List   Diagnosis Date Noted   Hyperlipidemia 06/13/2022   Diet-controlled diabetes mellitus (HCC) 06/13/2022   Past Medical History:  Diagnosis Date   Hypertension    No past surgical history on file. No Known Allergies Prior to Admission medications   Medication Sig Start Date End Date Taking? Authorizing Provider  amLODipine (NORVASC) 10 MG tablet Take 1 tablet (10 mg total) by mouth daily. 12/18/22  Yes Shade Flood, MD  atorvastatin (LIPITOR) 20 MG tablet Take 1 tablet (20 mg total) by mouth daily. 12/18/22  Yes Shade Flood, MD  insulin glargine (LANTUS SOLOSTAR) 100 UNIT/ML Solostar Pen Inject 10 Units into the skin daily. 12/31/22  Yes Shade Flood, MD  Insulin Pen Needle (NOVOFINE) 30G X 8 MM MISC Inject 10 each into the skin as needed. 12/31/22  Yes Shade Flood, MD  Iron, Ferrous Sulfate, 325 (65 Fe) MG TABS Take 325 mg by mouth daily. 01/02/23  Yes Shade Flood, MD  metFORMIN (GLUCOPHAGE) 500 MG tablet Take 1 tablet (500 mg total) by mouth 2 (two) times daily with a meal. Ok to start every day for few days, then increase to BID if tolerated. 12/31/22  Yes Shade Flood, MD   Social History   Socioeconomic History   Marital status:  Single    Spouse name: Not on file   Number of children: Not on file   Years of education: Not on file   Highest education level: GED or equivalent  Occupational History   Not on file  Tobacco Use   Smoking status: Never   Smokeless tobacco: Never  Vaping Use   Vaping status: Never Used  Substance and Sexual Activity   Alcohol use: Yes    Alcohol/week: 3.0 standard drinks of alcohol    Types: 3 Shots of liquor per week   Drug use: Never   Sexual activity: Yes  Other Topics Concern   Not on file  Social History Narrative   Not on file   Social Determinants of Health   Financial  Resource Strain: Low Risk  (01/19/2023)   Overall Financial Resource Strain (CARDIA)    Difficulty of Paying Living Expenses: Not hard at all  Food Insecurity: No Food Insecurity (01/19/2023)   Hunger Vital Sign    Worried About Running Out of Food in the Last Year: Never true    Ran Out of Food in the Last Year: Never true  Transportation Needs: No Transportation Needs (01/19/2023)   PRAPARE - Administrator, Civil Service (Medical): No    Lack of Transportation (Non-Medical): No  Physical Activity: Insufficiently Active (01/19/2023)   Exercise Vital Sign    Days of Exercise per Week: 1 day    Minutes of Exercise per Session: 20 min  Stress: No Stress Concern Present (01/19/2023)   [employer] of Occupational Health - Occupational Stress Questionnaire    Feeling of Stress : Not at all  Social Connections: Moderately Isolated (01/19/2023)   Social Connection and Isolation Panel [NHANES]    Frequency of Communication with Friends and Family: Twice a week    Frequency of Social Gatherings with Friends and Family: Once a week    Attends Religious Services: More than 4 times per year    Active Member of [employer] or Organizations: No    Attends Engineer, structural: Not on file    Marital Status: Never married  Intimate Partner Violence: Not on file    Review of Systems  Constitutional:  Negative for fatigue and unexpected weight change.  Eyes:  Negative for visual disturbance.  Respiratory:  Negative for cough, chest tightness and shortness of breath.   Cardiovascular:  Negative for chest pain, palpitations and leg swelling.  Gastrointestinal:  Negative for abdominal pain and blood in stool.  Neurological:  Negative for dizziness, light-headedness and headaches.     Objective:   Vitals:   01/19/23 0811  BP: 138/70  Pulse: 89  Temp: 97.7 F (36.5 C)  TempSrc: Temporal  SpO2: 100%  Weight: 145 lb 3.2 oz (65.9 kg)  Height: 5\' 5"  (1.651 m)      Physical Exam Vitals reviewed.  Constitutional:      Appearance: He is well-developed.  HENT:     Head: Normocephalic and atraumatic.  Neck:     Vascular: No carotid bruit or JVD.  Cardiovascular:     Rate and Rhythm: Normal rate and regular rhythm.     Heart sounds: Normal heart sounds. No murmur heard. Pulmonary:     Effort: Pulmonary effort is normal.     Breath sounds: Normal breath sounds. No rales.  Musculoskeletal:     Right lower leg: No edema.     Left lower leg: No edema.  Skin:    General: Skin is warm and  dry.  Neurological:     Mental Status: He is alert and oriented to person, place, and time.  Psychiatric:        Mood and Affect: Mood normal.     Results for orders placed or performed in visit on 01/19/23  POCT glucose (manual entry)  Result Value Ref Range   POC Glucose 134 (A) 70 - 99 mg/dl      Assessment & Plan:  John Obrien is a 47 y.o. male . Iron deficiency anemia, unspecified iron deficiency anemia type - Plan: Ambulatory referral to Hematology / Oncology, CBC  -Asymptomatic.  Importance of checking Cologuard discussed, increase iron supplement to twice per day for now with potential side effects discussed.  May balance out with some softer stools from metformin.  Refer to hematology, with RTC precautions.  Type 2 diabetes mellitus with hyperglycemia, without long-term current use of insulin (HCC) - Plan: Basic metabolic panel, POCT glucose (manual entry), Continuous Glucose Sensor (FREESTYLE LIBRE 3 SENSOR) MISC  -Significant improved control at home and 134 in the office.  Denies true lows, but did have a reading in the high 60s.  Asymptomatic at that time.  Has been using CGM which should pick up on trend towards hypoglycemia.  Will continue 10 units for now but if any lower readings below 100 then recommended cutting back to 8 units, any lower readings on 8 units then just stop insulin or can contact me to discuss plan further.  RTC  precautions, 6-week follow-up.  Continue metformin same dose for now.  Meds ordered this encounter  Medications   Continuous Glucose Sensor (FREESTYLE LIBRE 3 SENSOR) MISC    Sig: 1 Application by Does not apply route every 14 (fourteen) days. Place 1 sensor on the skin every 14 days. Use to check glucose continuously    Dispense:  6 each    Refill:  1   Patient Instructions  I will refer you to hematology for the anemia, ok to increase iron to twice per day if tolerated.  Continue metformin same dose for now.  If any readings below 100 decrease your insulin to 8 units.  If any further lower readings on that dose, let me know and we can decrease that medication or hold it if needed.  Recheck in 6 weeks.  Be seen sooner if any new or worsening symptoms.  Take care!     Signed,   Meredith Staggers, MD [facility] 01/19/23 8:33 AM

## 2023-01-27 ENCOUNTER — Other Ambulatory Visit: Payer: Self-pay | Admitting: Family Medicine

## 2023-01-27 DIAGNOSIS — R195 Other fecal abnormalities: Secondary | ICD-10-CM

## 2023-01-27 LAB — COLOGUARD: COLOGUARD: POSITIVE — AB

## 2023-01-27 NOTE — Progress Notes (Signed)
See labs, positive Cologuard.  Referral placed to gastroenterology for colonoscopy.

## 2023-01-28 ENCOUNTER — Other Ambulatory Visit: Payer: Self-pay

## 2023-01-28 ENCOUNTER — Telehealth: Payer: Self-pay | Admitting: Family Medicine

## 2023-01-28 NOTE — Telephone Encounter (Signed)
Caller name: Keane Martelli Fossett  On DPR?: Yes  Call back number: [phone] (home)  Provider they see: Shade Flood, MD  Reason for call:   Pt states he's returning Mackenie's call

## 2023-01-28 NOTE — Telephone Encounter (Signed)
Pt informed of the cologard results and made aware of referral

## 2023-02-07 ENCOUNTER — Inpatient Hospital Stay: Payer: BC Managed Care – PPO

## 2023-02-07 ENCOUNTER — Inpatient Hospital Stay: Payer: BC Managed Care – PPO | Attending: Nurse Practitioner | Admitting: Nurse Practitioner

## 2023-02-07 ENCOUNTER — Encounter: Payer: Self-pay | Admitting: Nurse Practitioner

## 2023-02-07 VITALS — BP 143/92 | HR 86 | Temp 98.2°F | Resp 20 | Wt 148.7 lb

## 2023-02-07 DIAGNOSIS — D509 Iron deficiency anemia, unspecified: Secondary | ICD-10-CM

## 2023-02-07 DIAGNOSIS — R634 Abnormal weight loss: Secondary | ICD-10-CM

## 2023-02-07 LAB — CBC WITH DIFFERENTIAL/PLATELET
Abs Immature Granulocytes: 0.01 10*3/uL (ref 0.00–0.07)
Basophils Absolute: 0.1 10*3/uL (ref 0.0–0.1)
Basophils Relative: 1 %
Eosinophils Absolute: 1 10*3/uL — ABNORMAL HIGH (ref 0.0–0.5)
Eosinophils Relative: 13 %
HCT: 36.4 % — ABNORMAL LOW (ref 39.0–52.0)
Hemoglobin: 10.6 g/dL — ABNORMAL LOW (ref 13.0–17.0)
Immature Granulocytes: 0 %
Lymphocytes Relative: 24 %
Lymphs Abs: 1.7 10*3/uL (ref 0.7–4.0)
MCH: 20.8 pg — ABNORMAL LOW (ref 26.0–34.0)
MCHC: 29.1 g/dL — ABNORMAL LOW (ref 30.0–36.0)
MCV: 71.5 fL — ABNORMAL LOW (ref 80.0–100.0)
Monocytes Absolute: 0.6 10*3/uL (ref 0.1–1.0)
Monocytes Relative: 9 %
Neutro Abs: 3.9 10*3/uL (ref 1.7–7.7)
Neutrophils Relative %: 53 %
Platelets: 540 10*3/uL — ABNORMAL HIGH (ref 150–400)
RBC: 5.09 MIL/uL (ref 4.22–5.81)
RDW: 25.5 % — ABNORMAL HIGH (ref 11.5–15.5)
WBC: 7.3 10*3/uL (ref 4.0–10.5)
nRBC: 0 % (ref 0.0–0.2)

## 2023-02-07 LAB — COMPREHENSIVE METABOLIC PANEL
ALT: 17 U/L (ref 0–44)
AST: 18 U/L (ref 15–41)
Albumin: 4.5 g/dL (ref 3.5–5.0)
Alkaline Phosphatase: 65 U/L (ref 38–126)
Anion gap: 8 (ref 5–15)
BUN: 15 mg/dL (ref 6–20)
CO2: 25 mmol/L (ref 22–32)
Calcium: 9.6 mg/dL (ref 8.9–10.3)
Chloride: 105 mmol/L (ref 98–111)
Creatinine, Ser: 0.82 mg/dL (ref 0.61–1.24)
GFR, Estimated: >60 mL/min (ref >60)
Glucose, Bld: 117 mg/dL — ABNORMAL HIGH (ref 70–99)
Potassium: 3.9 mmol/L (ref 3.5–5.1)
Sodium: 138 mmol/L (ref 135–145)
Total Bilirubin: 0.3 mg/dL (ref <1.2)
Total Protein: 8.1 g/dL (ref 6.5–8.1)

## 2023-02-07 LAB — IRON AND IRON BINDING CAPACITY (CC-WL,HP ONLY)
Iron: 336 ug/dL — ABNORMAL HIGH (ref 45–182)
Saturation Ratios: 63 % — ABNORMAL HIGH (ref 17.9–39.5)
TIBC: 533 ug/dL — ABNORMAL HIGH (ref 250–450)
UIBC: 197 ug/dL (ref 117–376)

## 2023-02-07 LAB — LACTATE DEHYDROGENASE: LDH: 136 U/L (ref 98–192)

## 2023-02-07 LAB — FERRITIN: Ferritin: 20 ng/mL — ABNORMAL LOW (ref 24–336)

## 2023-02-07 NOTE — Progress Notes (Signed)
[facility]  [place], [place]  Clinic Day:  02/07/2023  Referring physician: Shade Flood, MD  Chief Complaints: Iron Deficiency Anemia  History of Presenting Illness: John Obrien 47 y.o. male with past medical history of T2DM, hyperlipidemia, hypertension, who presents for evaluation of anemia. During workup with PCP, he was found to have anemia and underwent additional workup which was consistent with iron deficiency. He reports being told he had anemia in high school but it resolved and has not been a problem since. He endorses shortness of breath with exertion and dizziness. Increased ice craving x 2 years. He has started oral iron tablets x 2 months but reports diarrhea. Denies black or bloody stools. No blood in urine. Has unintentionally lost 10 lbs over past year and has increased early satiety. Denies bruising, hemoptysis. No palpitations. Denies NSAID use. Not on antiplatelet medications. Denies history of GI Bleed, CKD, no autoimmune disease. He had positive cologard but has not had colonoscopy. He's never donated blood. Unsure if he's had blood transfusion.   Dad has history of stroke. Dad and mom are living. Denies history of cancer in family.   He works in a Network engineer. Lives with girlfriend, Delorise Jackson. He enjoys travel and music. Has daughter who is 66.   Review of Systems  Constitutional:  Positive for appetite change, fatigue and unexpected weight change. Negative for chills, diaphoresis and fever.  HENT:   Negative for lump/mass, mouth sores, sore throat and trouble swallowing.   Eyes:  Negative for icterus.  Respiratory:  Negative for chest tightness, cough, hemoptysis and shortness of breath.   Cardiovascular:  Negative for chest pain, leg swelling and palpitations.  Gastrointestinal:  Positive for diarrhea. Negative for abdominal pain, constipation, nausea, rectal pain and vomiting.  Endocrine: Negative for hot flashes.  Genitourinary:  Negative for  bladder incontinence, difficulty urinating, dysuria, hematuria and pelvic pain.   Musculoskeletal:  Negative for arthralgias, flank pain, myalgias and neck stiffness.  Skin:  Negative for itching, rash and wound.  Neurological:  Positive for light-headedness. Negative for dizziness, headaches and numbness.  Hematological:  Negative for adenopathy. Does not bruise/bleed easily.  Psychiatric/Behavioral:  Negative for confusion, depression and sleep disturbance. The patient is not nervous/anxious.      Past Medical History:  Diagnosis Date   Diabetes (HCC)    Hypertension     No past surgical history on file.  Family History  Problem Relation Age of Onset   Diabetes Mother    Stroke Father    Diabetes Sister      reports that he has never smoked. He has never used smokeless tobacco. He reports current alcohol use of about 3.0 standard drinks of alcohol per week. He reports that he does not use drugs.  No Known Allergies  Current Outpatient Medications  Medication Sig Dispense Refill   amLODipine (NORVASC) 10 MG tablet Take 1 tablet (10 mg total) by mouth daily. 90 tablet 1   atorvastatin (LIPITOR) 20 MG tablet Take 1 tablet (20 mg total) by mouth daily. 90 tablet 1   insulin glargine (LANTUS SOLOSTAR) 100 UNIT/ML Solostar Pen Inject 10 Units into the skin daily. 15 mL PRN   Insulin Pen Needle (NOVOFINE) 30G X 8 MM MISC Inject 10 each into the skin as needed. 90 each 1   Iron, Ferrous Sulfate, 325 (65 Fe) MG TABS Take 325 mg by mouth daily. 30 tablet 5   metFORMIN (GLUCOPHAGE) 500 MG tablet Take 1 tablet (500 mg total)  by mouth 2 (two) times daily with a meal. Ok to start every day for few days, then increase to BID if tolerated. 180 tablet 1   No current facility-administered medications for this visit.    Objective:  Blood pressure (!) 143/92, pulse 86, temperature 98.2 F (36.8 C), resp. rate 20, weight 148 lb 11.2 oz (67.4 kg), SpO2 100%.  Wt Readings from Last 3 Encounters:   02/07/23 148 lb 11.2 oz (67.4 kg)  01/19/23 145 lb 3.2 oz (65.9 kg)  12/31/22 143 lb 9.6 oz (65.1 kg)    Body mass index is 24.74 kg/m.  Performance status (ECOG): 1 - Symptomatic but completely ambulatory Physical Exam Vitals reviewed.  Constitutional:      Appearance: He is not ill-appearing.  HENT:     Head: Normocephalic and atraumatic.     Mouth/Throat:     Mouth: Mucous membranes are moist.     Pharynx: No oropharyngeal exudate.  Cardiovascular:     Rate and Rhythm: Normal rate and regular rhythm.  Pulmonary:     Effort: No respiratory distress.     Breath sounds: No wheezing.  Abdominal:     General: There is no distension.     Tenderness: There is no abdominal tenderness. There is no guarding.  Musculoskeletal:        General: No deformity.  Skin:    General: Skin is warm.     Coloration: Skin is not jaundiced or pale.     Findings: No bruising.  Neurological:     Mental Status: He is alert and oriented to person, place, and time.  Psychiatric:        Mood and Affect: Mood normal.        Behavior: Behavior normal.       Latest Ref Rng & Units 02/07/2023   10:28 AM 01/19/2023    8:36 AM 12/31/2022   10:55 AM  CBC  WBC 4.0 - 10.5 K/uL 7.3  9.0  8.1   Hemoglobin 13.0 - 17.0 g/dL 40.9  9.5  9.4   Hematocrit 39.0 - 52.0 % 36.4  33.5  33.2   Platelets 150 - 400 K/uL 540  733.0  756.0       Latest Ref Rng & Units 02/07/2023   10:28 AM 01/19/2023    8:36 AM 12/31/2022   10:55 AM  CMP  Glucose 70 - 99 mg/dL 811  914  782   BUN 6 - 20 mg/dL 15  13  7    Creatinine 0.61 - 1.24 mg/dL 9.56  2.13  0.86   Sodium 135 - 145 mmol/L 138  136  137   Potassium 3.5 - 5.1 mmol/L 3.9  4.3  4.6   Chloride 98 - 111 mmol/L 105  103  100   CO2 22 - 32 mmol/L 25  26  28    Calcium 8.9 - 10.3 mg/dL 9.6  9.6  57.8   Total Protein 6.5 - 8.1 g/dL 8.1     Total Bilirubin <1.2 mg/dL 0.3     Alkaline Phos 38 - 126 U/L 65     AST 15 - 41 U/L 18     ALT 0 - 44 U/L 17      Lab  Results  Component Value Date   TIBC 533 (H) 02/07/2023   TIBC 572 (H) 12/31/2022   FERRITIN 20 (L) 02/07/2023   FERRITIN 2 (L) 12/31/2022   IRONPCTSAT 63 (H) 02/07/2023   IRONPCTSAT 4 (L) 12/31/2022   Lab  Results  Component Value Date   LDH 136 02/07/2023   No results found.    Assessment & Plan:  John Obrien is a 47 y.o. male who presents for evaluation of   # Anemia- Sep 2024- Hmg 9, Ferritin 2, Iron sat 4%. Started oral iron per pcp. Today, Hemoglobin 10.6. Ferritin 20. Improved. Poor tolerance to oral iron. Continues to be symptomatic of anemia. Likely due to iron deficiency - from etiology GI blood loss/malabsorption.  Poor tolerance on oral iron.  Discussed regarding IV iron infusion/Venofer. Discussed the potential acute infusion reactions with IV iron; which are quite rare.  Patient understands the risk; will proceed with infusions. Recommend venofer x 5, weekly.   # Recommend CBC w/ diff, CMP, LDH, ferritin, iron studies. Recommend GI evaluation-EGD colonoscopy; capsule study; CT scan chest/abdomen/pelvis given unintentional weight loss.   # I had a long discussion with the patient regarding various forms of dietary iron as it is present universally in meat.  Also discussed regarding plant sources, including but not limited to leafy vegetables [spinach], broccoli. He can hold iron for now. May consider gentle iron- less likely to cause GI upset once iron stores are replenished.   Disposition:  Ref GI CT C/A/P Labs today:  Venofer weekly x 5 2 mo- lab (cbc, cmp, ferritin,iron studies, smear, ldh, haptoglobin, b12, folate, UA), see Heather or Lacie to establish care, +/- venofer- la  Thank you Dr. Chilton Si for allowing me to participate in the care of your pleasant patient. Please do not hesitate to contact me with questions or concerns in the interim.  I discussed the assessment and treatment plan with the patient.  The patient was provided an opportunity to ask questions  and all were answered.  The patient agreed with the plan and demonstrated an understanding of the instructions.  The patient was advised to call back if the symptoms worsen or if the condition fails to improve as anticipated.  Thank you for the opportunity to participate in the care of this very pleasant patient  Consuello Masse, DNP, AGNP-C [facility]  CC: Dr Neva Seat

## 2023-02-12 ENCOUNTER — Other Ambulatory Visit: Payer: Self-pay | Admitting: Nurse Practitioner

## 2023-02-12 ENCOUNTER — Telehealth: Payer: Self-pay | Admitting: Pharmacy Technician

## 2023-02-12 NOTE — Progress Notes (Signed)
Venofer orders changed to [address] to allow for sooner infusions.

## 2023-02-12 NOTE — Telephone Encounter (Signed)
Doyce Loose note:  patient will be scheduled as soon as possible.  Auth Submission: NO AUTH NEEDED Site of care: Site of care: [facility] Payer: bcbs Medication & CPT/J Code(s) submitted: Venofer (Iron Sucrose) J1756 Route of submission (phone, fax, portal):  Phone # Fax # Auth type: Buy/Bill PB Units/visits requested: 5 Reference number:  Approval from: 02/12/23 to 03/24/23

## 2023-02-13 ENCOUNTER — Other Ambulatory Visit: Payer: Self-pay | Admitting: Pharmacy Technician

## 2023-02-13 ENCOUNTER — Telehealth: Payer: Self-pay

## 2023-02-13 NOTE — Telephone Encounter (Signed)
John Obrien, patient will be scheduled as soon as possible.  Auth Submission: NO AUTH NEEDED Site of care: Site of care: [facility] Payer: [employer] Medication & CPT/J Code(s) submitted: Venofer (Iron Sucrose) J1756 Route of submission (phone, fax, portal):  Phone # Fax # Auth type: Buy/Bill PB Units/visits requested: 200mg  x 5 doses Reference number:  Approval from: 02/13/23 to 03/24/23

## 2023-02-23 ENCOUNTER — Encounter: Payer: Self-pay | Admitting: Pediatrics

## 2023-02-27 ENCOUNTER — Ambulatory Visit (HOSPITAL_COMMUNITY): Payer: BC Managed Care – PPO

## 2023-02-27 ENCOUNTER — Encounter (HOSPITAL_COMMUNITY): Payer: Self-pay

## 2023-03-02 ENCOUNTER — Ambulatory Visit: Payer: BC Managed Care – PPO

## 2023-03-06 ENCOUNTER — Ambulatory Visit (INDEPENDENT_AMBULATORY_CARE_PROVIDER_SITE_OTHER): Payer: BC Managed Care – PPO

## 2023-03-06 VITALS — BP 168/91 | HR 81 | Temp 98.3°F | Resp 16 | Ht 63.0 in | Wt 150.2 lb

## 2023-03-06 DIAGNOSIS — D509 Iron deficiency anemia, unspecified: Secondary | ICD-10-CM

## 2023-03-06 MED ORDER — DIPHENHYDRAMINE HCL 25 MG PO CAPS
25.0000 mg | ORAL_CAPSULE | Freq: Once | ORAL | Status: AC
  Administered 2023-03-06: 25 mg via ORAL
  Filled 2023-03-06: qty 1

## 2023-03-06 MED ORDER — ACETAMINOPHEN 325 MG PO TABS
650.0000 mg | ORAL_TABLET | Freq: Once | ORAL | Status: AC
  Administered 2023-03-06: 650 mg via ORAL
  Filled 2023-03-06: qty 2

## 2023-03-06 MED ORDER — IRON SUCROSE 20 MG/ML IV SOLN
200.0000 mg | Freq: Once | INTRAVENOUS | Status: AC
Start: 2023-03-06 — End: 2023-03-06
  Administered 2023-03-06: 200 mg via INTRAVENOUS
  Filled 2023-03-06: qty 10

## 2023-03-06 NOTE — Progress Notes (Signed)
 Diagnosis: Iron Deficiency Anemia  Provider:  Chilton Greathouse MD  Procedure: IV Push  IV Type: Peripheral, IV Location: L Antecubital  Venofer (Iron Sucrose), Dose: 200 mg  Post Infusion IV Care: Observation period completed and Peripheral IV Discontinued  Discharge: Condition: Good, Destination: Home . AVS Provided  Performed by:  Rico Ala, LPN

## 2023-03-09 ENCOUNTER — Ambulatory Visit: Payer: BC Managed Care – PPO

## 2023-03-13 ENCOUNTER — Ambulatory Visit: Payer: BC Managed Care – PPO

## 2023-03-13 VITALS — BP 158/84 | HR 85 | Temp 98.5°F | Resp 18

## 2023-03-13 DIAGNOSIS — D509 Iron deficiency anemia, unspecified: Secondary | ICD-10-CM

## 2023-03-13 MED ORDER — ACETAMINOPHEN 325 MG PO TABS
650.0000 mg | ORAL_TABLET | Freq: Once | ORAL | Status: AC
  Administered 2023-03-13: 650 mg via ORAL
  Filled 2023-03-13: qty 2

## 2023-03-13 MED ORDER — IRON SUCROSE 20 MG/ML IV SOLN
200.0000 mg | Freq: Once | INTRAVENOUS | Status: AC
  Administered 2023-03-13: 200 mg via INTRAVENOUS
  Filled 2023-03-13: qty 10

## 2023-03-13 MED ORDER — DIPHENHYDRAMINE HCL 25 MG PO CAPS: 25.0000 mg | ORAL_CAPSULE | Freq: Once | ORAL | Status: DC

## 2023-03-13 NOTE — Progress Notes (Signed)
Diagnosis: Iron Deficiency Anemia  Provider:  Chilton Greathouse MD  Procedure: IV Push  IV Type: Peripheral, IV Location: L Hand  Venofer (Iron Sucrose), Dose: 200 mg  Post Infusion IV Care: Observation period completed and Peripheral IV Discontinued  Discharge: Condition: Good, Destination: Home . AVS Declined  Performed by:  Nat Math, RN   Pt refused to take the Benadryl 25 mg due to the medicine making him feel too drowsy. Pt was educated by the nurse on the importance of taking pre meds in the event if he was to have a reaction. Patient verbalized understanding.

## 2023-03-19 ENCOUNTER — Ambulatory Visit: Payer: BC Managed Care – PPO | Admitting: Family Medicine

## 2023-03-23 ENCOUNTER — Ambulatory Visit: Payer: BC Managed Care – PPO

## 2023-03-30 ENCOUNTER — Ambulatory Visit: Payer: BC Managed Care – PPO

## 2023-04-02 ENCOUNTER — Telehealth: Payer: Self-pay

## 2023-04-02 NOTE — Progress Notes (Signed)
 Care Guide Pharmacy Note  04/02/2023 Name: John Obrien MRN: [id number] DOB: 1975-06-29  Referred By: [name], MD Reason for referral: Care Coordination (TNM Diabetes. )   John Obrien is a 48 y.o. year old male who is a primary care patient of Levora, Reyes SAUNDERS, MD.  John Obrien was referred to the pharmacist for assistance related to: DMII  Successful contact was made with the patient to discuss pharmacy services.  Patient declines engagement at this time. Contact information was provided to the patient should they wish to reach out for assistance at a later time.  Dreama [facility] Assistant [phone]

## 2023-04-03 ENCOUNTER — Ambulatory Visit (INDEPENDENT_AMBULATORY_CARE_PROVIDER_SITE_OTHER): Payer: BC Managed Care – PPO

## 2023-04-03 VITALS — BP 160/91 | HR 85 | Temp 98.2°F | Resp 12 | Ht 65.0 in | Wt 152.4 lb

## 2023-04-03 DIAGNOSIS — D509 Iron deficiency anemia, unspecified: Secondary | ICD-10-CM | POA: Diagnosis not present

## 2023-04-03 MED ORDER — IRON SUCROSE 20 MG/ML IV SOLN
200.0000 mg | Freq: Once | INTRAVENOUS | Status: DC
Start: 2023-04-03 — End: 2023-06-17

## 2023-04-03 MED ORDER — ACETAMINOPHEN 325 MG PO TABS
650.0000 mg | ORAL_TABLET | Freq: Once | ORAL | Status: AC
  Administered 2023-04-03: 650 mg via ORAL
  Filled 2023-04-03: qty 2

## 2023-04-03 MED ORDER — ACETAMINOPHEN 325 MG PO TABS: 650.0000 mg | ORAL_TABLET | Freq: Once | ORAL | Status: DC

## 2023-04-03 MED ORDER — IRON SUCROSE 20 MG/ML IV SOLN
200.0000 mg | Freq: Once | INTRAVENOUS | Status: AC
  Administered 2023-04-03: 200 mg via INTRAVENOUS
  Filled 2023-04-03: qty 10

## 2023-04-03 MED ORDER — DIPHENHYDRAMINE HCL 25 MG PO CAPS
25.0000 mg | ORAL_CAPSULE | Freq: Once | ORAL | Status: AC
  Administered 2023-04-03: 25 mg via ORAL
  Filled 2023-04-03: qty 1

## 2023-04-03 MED ORDER — DIPHENHYDRAMINE HCL 25 MG PO CAPS
25.0000 mg | ORAL_CAPSULE | Freq: Once | ORAL | Status: DC
Start: 2023-04-03 — End: 2023-06-17

## 2023-04-03 NOTE — Progress Notes (Signed)
 Diagnosis: Iron  Deficiency Anemia  Provider:  Praveen Mannam MD  Procedure: IV Push  IV Type: Peripheral, IV Location: L Antecubital  Venofer  (Iron  Sucrose), Dose: 200 mg  Post Infusion IV Care: Observation period completed and Peripheral IV Discontinued  Discharge: Condition: Good, Destination: Home . AVS Declined  Performed by:  Maximiano JONELLE Pouch, LPN

## 2023-04-06 ENCOUNTER — Ambulatory Visit: Payer: BC Managed Care – PPO

## 2023-04-06 ENCOUNTER — Ambulatory Visit: Payer: BC Managed Care – PPO | Admitting: Family Medicine

## 2023-04-06 NOTE — Progress Notes (Signed)
 [facility] Initial Consultation   Referring Provider [name], MD [address],  [place] [address]  Primary Care Provider [name], MD  Patient Profile: John Obrien is a 48 y.o. male with a past medical history noteworthy for diet-controlled diabetes mellitus, HTN and hyperlipidemia who is seen in consultation in the [facility] at the request of Dr. Levora for evaluation and management of the problem(s) noted below.  Problem List: Iron  deficiency anemia Unintentional weight loss Cologuard positive 01/19/2023  History of Present Illness   [name] is a 48 y.o. male with a history of diet-controlled diabetes mellitus, HTN and hyperlipidemia who presents to the office to discuss evaluation and management of iron  deficiency anemia, unintentional weight loss and recent positive Cologuard  Labs over the last 3 months have shown hemoglobin 9-10.6  Labs 02/07/2023: WBC 7.3, hemoglobin 10.6, hematocrit 36.4, platelets 540                              Iron  336, TIBC 533, ferritin 20, LDH 136 01/19/2023 Cologuard positive  He has been treated with oral iron  and received Venofer  infusions  Mr. Braddy denies abdominal pain, nausea or vomiting Currently having 2 formed bowel movements a day without any blood or mucus States that after he started on iron  he had a slight increase in his bowel frequency but no diarrhea Indicates that his stools have been darker since initiation of iron  supplementation but denies melena or hematochezia No history of peptic ulcer disease, GERD or gastritis  He endorses an unexplained 10 pound weight loss over the last 6 months -scheduled by hematology to have a CT scan of the chest abdomen and pelvis Denies early satiety Reports eating a diet with meat, chili beans but not many greens  He is unsure if he may have had a colonoscopy as a child States that in high school he had low iron  and weight loss  and thinks he may have had a colonoscopy but does not recall results Denies any chronic GI conditions  No family history of colorectal cancer or gastrointestinal foregut cancers  No tobacco use Reports consuming 2 alcoholic beverages per night  Last colonoscopy: ?  Thinks he may have had a colonoscopy in high school but does not know results Last endoscopy: None  Last Abd CT/CTE/MRE: 12/2005 -moderately severe left hydronephrosis with a left ureteral stone, 2 hypoattenuating liver lesions most consistent with hemangiomas  GI Review of Symptoms Significant for none. Otherwise negative.  General Review of Systems  Review of systems is significant for the pertinent positives and negatives as listed per the HPI.  Full ROS is otherwise negative.  Past Medical History   Past Medical History:  Diagnosis Date   Diabetes (HCC)    Hyperlipidemia    Hypertension    Iron  deficiency anemia       Past Surgical History  Surgery for kidney stones  Allergies and Medications  No Known Allergies   Current Meds  Medication Sig   amLODipine  (NORVASC ) 10 MG tablet Take 1 tablet (10 mg total) by mouth daily.   atorvastatin  (LIPITOR) 20 MG tablet Take 1 tablet (20 mg total) by mouth daily.   insulin  glargine (LANTUS  SOLOSTAR) 100 UNIT/ML Solostar Pen Inject 10 Units into the skin daily.   Insulin  Pen Needle (NOVOFINE) 30G X 8 MM MISC Inject 10 each into the skin as needed.   Iron , Ferrous  Sulfate, 325 (65 Fe) MG TABS Take 325 mg by mouth daily.   metFORMIN  (GLUCOPHAGE ) 500 MG tablet Take 1 tablet (500 mg total) by mouth 2 (two) times daily with a meal. Ok to start every day for few days, then increase to BID if tolerated.   [EXPIRED] PEG 3350-KCl-NaCl-NaSulf-MgSul (SUFLAVE ) 178.7 g SOLR Take 1 kit by mouth once for 1 dose.   SEMGLEE , YFGN, 100 UNIT/ML Pen Inject 10 Units into the skin daily.      Family History   Family History  Problem Relation Age of Onset   Diabetes Mother    Stroke  Father    Diabetes Sister     Social History   Employed in a warehouse  [name] reports that he has never smoked. He has never used smokeless tobacco. He reports current alcohol use of about 3.0 standard drinks of alcohol per week. He reports that he does not use drugs.  Vital Signs and Physical Examination   Vitals:   04/07/23 1327  BP: 136/78  Pulse: 94  Height: 5' 5 (1.651 m)  Weight: 158 lb 6 oz (71.8 kg)  SpO2: 93%  BMI (Calculated): 26.35    General: Well developed, well nourished, no acute distress Head: Normocephalic and atraumatic Eyes: Sclerae anicteric, EOMI Ears: Normal auditory acuity Mouth: No deformities or lesions noted Lungs: Clear throughout to auscultation Heart: Regular rate and rhythm; No murmurs, rubs or bruits Abdomen: Soft, non tender and non distended. No masses, hepatosplenomegaly or hernias noted. Normal Bowel sounds Rectal:Defrred Musculoskeletal: Symmetrical with no gross deformities  Pulses:  Normal pulses noted Extremities: No edema or deformities noted Neurological: Alert oriented x 4, grossly nonfocal Psychological:  Alert and cooperative. Normal mood and affect  Review of Data  The following data was reviewed at the time of this encounter:  Laboratory Studies   Lab Results  Component Value Date   WBC 7.3 02/07/2023   HGB 10.6 (L) 02/07/2023   HCT 36.4 (L) 02/07/2023   MCV 71.5 (L) 02/07/2023   PLT 540 (H) 02/07/2023     Chemistry      Component Value Date/Time   NA 138 02/07/2023 1028   NA 137 02/09/2020 1041   K 3.9 02/07/2023 1028   CL 105 02/07/2023 1028   CO2 25 02/07/2023 1028   BUN 15 02/07/2023 1028   BUN 14 02/09/2020 1041   CREATININE 0.82 02/07/2023 1028      Component Value Date/Time   CALCIUM  9.6 02/07/2023 1028   ALKPHOS 65 02/07/2023 1028   AST 18 02/07/2023 1028   ALT 17 02/07/2023 1028   BILITOT 0.3 02/07/2023 1028   BILITOT <0.2 02/09/2020 1041     Lab Results  Component Value Date   IRON  336  (H) 02/07/2023   TIBC 533 (H) 02/07/2023   FERRITIN 20 (L) 02/07/2023    Lab Results  Component Value Date   TSH 0.88 12/18/2022    Imaging Studies  CTAP 12/2005 IMPRESSION:  1. Moderately severe left hydronephrosis with a 0.6-cm distal left ureteral stone.   2. Likely nonobstructing punctate calculus in the lower pole of the right kidney.   3. Two hypoattenuating liver lesions, most consistent with hemangiomas.   GI Procedures and Studies  No reports available in epic Patient thinks he may have had a colonoscopy in the remote past in high school when he had low iron  and weight loss but does not know results  Clinical Impression  It is my clinical impression that Mr. Yanik is  a 48 y.o. male with;  Iron  deficiency anemia Unintentional weight loss Cologuard positive 01/19/2023  Mr. Reiber presents for evaluation and management of iron  deficiency anemia, unintentional weight loss in the setting of a positive Cologuard.  He is not endorsing any active GI symptoms such as abdominal pain, constipation, diarrhea, melena or hematochezia.  No upper GI tract symptoms of GERD, nausea, vomiting.  We discussed that in light of his symptoms and laboratory studies it would be appropriate to proceed with upper endoscopy and colonoscopy to evaluate for gastrointestinal conditions contributing to poor absorption of iron  or chronic GI losses.  I reviewed the risks of upper endoscopy and colonoscopy in Mr. Ky is agreeable to proceed.  Plan  Schedule EGD and colonoscopy in [facility] Agree with CT scan of the chest abdomen pelvis as scheduled by hematology given laboratory studies and weight loss  Planned Follow Up PRN  The patient or caregiver verbalized understanding of the material covered, with no barriers to understanding. All questions were answered. Patient or caregiver is agreeable with the plan outlined above.    It was a pleasure to see Fahad.  If you have any questions or concerns  regarding this evaluation, do not hesitate to contact me.  Inocente Hausen, MD [facility]   I spent total of 30 minutes in both face-to-face and non-face-to-face activities, excluding procedures performed, for the visit on the date of this encounter.

## 2023-04-07 ENCOUNTER — Ambulatory Visit: Payer: BC Managed Care – PPO | Admitting: Pediatrics

## 2023-04-07 ENCOUNTER — Encounter: Payer: Self-pay | Admitting: Pediatrics

## 2023-04-07 VITALS — BP 136/78 | HR 94 | Ht 65.0 in | Wt 158.4 lb

## 2023-04-07 DIAGNOSIS — D509 Iron deficiency anemia, unspecified: Secondary | ICD-10-CM | POA: Diagnosis not present

## 2023-04-07 DIAGNOSIS — R634 Abnormal weight loss: Secondary | ICD-10-CM

## 2023-04-07 DIAGNOSIS — R195 Other fecal abnormalities: Secondary | ICD-10-CM | POA: Diagnosis not present

## 2023-04-07 MED ORDER — SUFLAVE 178.7 G PO SOLR
1.0000 | Freq: Once | ORAL | 0 refills | Status: AC
Start: 2023-04-07 — End: 2023-04-07

## 2023-04-07 NOTE — Patient Instructions (Addendum)
 You have been scheduled for an endoscopy and colonoscopy. Please follow the written instructions given to you at your visit today.  Please pick up your prep supplies at the pharmacy within the next 1-3 days.  If you use inhalers (even only as needed), please bring them with you on the day of your procedure.  DO NOT TAKE 7 DAYS PRIOR TO TEST- Trulicity (dulaglutide) Ozempic, Wegovy (semaglutide) Mounjaro (tirzepatide) Bydureon Bcise (exanatide extended release)  DO NOT TAKE 1 DAY PRIOR TO YOUR TEST Rybelsus (semaglutide) Adlyxin (lixisenatide) Victoza (liraglutide) Byetta (exanatide) ___________________________________________________________________________  Rosine will receive your bowel preparation through [facility], which ensures the lowest copay and home delivery, with outreach via text or call from an [phone] number. Please respond promptly to avoid rescheduling. If you are interested in alternative options or have any questions please contact them at [phone]  Your Provider Has Sent Your Bowel Prep Regimen To [facility] What to expect. [facility] will contact you to verify your information and collect your copay, if applicable. Enjoy the comfort of your home while we deliver your prescription to you, free of any shipping charges. Fast, FREE delivery or shipping. [facility] accepts all major insurance benefits and applies discounts & coupons  Have additional questions? [facility]'s patient care team is always here to help.  Chat: [email or web address] Call: [phone] Email: [email or web address] NCPDP: [id number] How will we contact you? Welcome Phone call  a Welcome text and a Checkout link in a text Texts you receive from [phone] Are Not Spam.   *To set up delivery, you must complete the checkout process via link or speak to one of our patient care representatives. If we are unable to reach you, your prescription may be  delayed.  _______________________________________________________  If your blood pressure at your visit was 140/90 or greater, please contact your primary care physician to follow up on this.  _______________________________________________________  If you are age 93 or older, your body mass index should be between 23-30. Your Body mass index is 26.35 kg/m. If this is out of the aforementioned range listed, please consider follow up with your Primary Care Provider.  If you are age 45 or younger, your body mass index should be between 19-25. Your Body mass index is 26.35 kg/m. If this is out of the aformentioned range listed, please consider follow up with your Primary Care Provider.   ________________________________________________________  The [facility] providers would like to encourage you to use MYCHART to communicate with providers for non-urgent requests or questions.  Due to long hold times on the telephone, sending your provider a message by [facility] may be a faster and more efficient way to get a response.  Please allow 48 business hours for a response.  Please remember that this is for non-urgent requests.  _______________________________________________________  Thank you for entrusting me with your care and choosing [facility].  Dr Suzann

## 2023-04-10 ENCOUNTER — Ambulatory Visit: Payer: BC Managed Care – PPO

## 2023-04-10 VITALS — BP 144/86 | HR 85 | Temp 97.8°F | Resp 18 | Ht 65.0 in | Wt 153.8 lb

## 2023-04-10 DIAGNOSIS — D509 Iron deficiency anemia, unspecified: Secondary | ICD-10-CM | POA: Diagnosis not present

## 2023-04-10 MED ORDER — ACETAMINOPHEN 325 MG PO TABS
650.0000 mg | ORAL_TABLET | Freq: Once | ORAL | Status: AC
Start: 2023-04-10 — End: 2023-04-10
  Administered 2023-04-10: 650 mg via ORAL
  Filled 2023-04-10: qty 2

## 2023-04-10 MED ORDER — DIPHENHYDRAMINE HCL 25 MG PO CAPS
25.0000 mg | ORAL_CAPSULE | Freq: Once | ORAL | Status: AC
  Administered 2023-04-10: 25 mg via ORAL
  Filled 2023-04-10: qty 1

## 2023-04-10 MED ORDER — IRON SUCROSE 20 MG/ML IV SOLN
200.0000 mg | Freq: Once | INTRAVENOUS | Status: AC
  Administered 2023-04-10: 200 mg via INTRAVENOUS
  Filled 2023-04-10: qty 10

## 2023-04-10 NOTE — Progress Notes (Signed)
Diagnosis: Iron Deficiency Anemia  Provider:  Chilton Greathouse MD  Procedure: IV Push  IV Type: Peripheral, IV Location: L Forearm  Venofer (Iron Sucrose), Dose: 200 mg  Post Infusion IV Care: Observation period completed and Peripheral IV Discontinued  Discharge: Condition: Good, Destination: Home . AVS Declined  Performed by:  Garnette Czech, RN

## 2023-04-17 ENCOUNTER — Encounter: Payer: Self-pay | Admitting: Nurse Practitioner

## 2023-04-17 ENCOUNTER — Ambulatory Visit: Payer: BC Managed Care – PPO

## 2023-04-17 ENCOUNTER — Ambulatory Visit: Payer: BC Managed Care – PPO | Admitting: Family Medicine

## 2023-04-17 VITALS — BP 136/78 | HR 92 | Temp 99.1°F | Ht 65.0 in | Wt 152.8 lb

## 2023-04-17 VITALS — BP 138/82 | HR 90 | Temp 98.3°F | Resp 14 | Ht 65.0 in | Wt 156.2 lb

## 2023-04-17 DIAGNOSIS — E1165 Type 2 diabetes mellitus with hyperglycemia: Secondary | ICD-10-CM

## 2023-04-17 DIAGNOSIS — E785 Hyperlipidemia, unspecified: Secondary | ICD-10-CM | POA: Diagnosis not present

## 2023-04-17 DIAGNOSIS — D509 Iron deficiency anemia, unspecified: Secondary | ICD-10-CM

## 2023-04-17 DIAGNOSIS — I10 Essential (primary) hypertension: Secondary | ICD-10-CM

## 2023-04-17 LAB — LIPID PANEL
Cholesterol: 192 mg/dL (ref 0–200)
HDL: 60.1 mg/dL (ref >39.00)
LDL Cholesterol: 95 mg/dL (ref 0–99)
NonHDL: 131.84
Total CHOL/HDL Ratio: 3
Triglycerides: 183 mg/dL — ABNORMAL HIGH (ref 0.0–149.0)
VLDL: 36.6 mg/dL (ref 0.0–40.0)

## 2023-04-17 LAB — COMPREHENSIVE METABOLIC PANEL
ALT: 23 U/L (ref 0–53)
AST: 22 U/L (ref 0–37)
Albumin: 5 g/dL (ref 3.5–5.2)
Alkaline Phosphatase: 76 U/L (ref 39–117)
BUN: 11 mg/dL (ref 6–23)
CO2: 27 meq/L (ref 19–32)
Calcium: 10.1 mg/dL (ref 8.4–10.5)
Chloride: 100 meq/L (ref 96–112)
Creatinine, Ser: 0.73 mg/dL (ref 0.40–1.50)
GFR: 106.83 mL/min (ref >60.00)
Glucose, Bld: 127 mg/dL — ABNORMAL HIGH (ref 70–99)
Potassium: 4.4 meq/L (ref 3.5–5.1)
Sodium: 136 meq/L (ref 135–145)
Total Bilirubin: 0.3 mg/dL (ref 0.2–1.2)
Total Protein: 8.3 g/dL (ref 6.0–8.3)

## 2023-04-17 LAB — HEMOGLOBIN A1C: Hgb A1c MFr Bld: 6.8 % — ABNORMAL HIGH (ref 4.6–6.5)

## 2023-04-17 MED ORDER — DIPHENHYDRAMINE HCL 25 MG PO CAPS
25.0000 mg | ORAL_CAPSULE | Freq: Once | ORAL | Status: AC
  Administered 2023-04-17: 25 mg via ORAL
  Filled 2023-04-17: qty 1

## 2023-04-17 MED ORDER — AMLODIPINE BESYLATE 10 MG PO TABS: 10.0000 mg | ORAL_TABLET | Freq: Every day | ORAL | 1 refills | Status: DC

## 2023-04-17 MED ORDER — ACETAMINOPHEN 325 MG PO TABS
650.0000 mg | ORAL_TABLET | Freq: Once | ORAL | Status: AC
  Administered 2023-04-17: 650 mg via ORAL
  Filled 2023-04-17: qty 2

## 2023-04-17 MED ORDER — IRON SUCROSE 20 MG/ML IV SOLN
200.0000 mg | Freq: Once | INTRAVENOUS | Status: AC
  Administered 2023-04-17: 200 mg via INTRAVENOUS
  Filled 2023-04-17: qty 10

## 2023-04-17 MED ORDER — FREESTYLE LIBRE 3 SENSOR MISC: 1.0000 | 1 refills | Status: DC

## 2023-04-17 MED ORDER — ATORVASTATIN CALCIUM 20 MG PO TABS: 20.0000 mg | ORAL_TABLET | Freq: Every day | ORAL | 1 refills | Status: DC

## 2023-04-17 MED ORDER — METFORMIN HCL 500 MG PO TABS: 500.0000 mg | ORAL_TABLET | Freq: Two times a day (BID) | ORAL | 1 refills | Status: DC

## 2023-04-17 NOTE — Progress Notes (Unsigned)
Subjective:  Patient ID: John Obrien, male    DOB: 13-Aug-1975  Age: 48 y.o. MRN: [id number]  CC:  Chief Complaint  Patient presents with   Medical Management of Chronic Issues    Pt is doing well, no concerns or questions today,     HPI John Obrien presents for   Iron deficiency anemia With heme-negative stool, iron supplementation twice daily dosing discussed at prior visits.  Asymptomatic at that time.  Referred to hematology, then gastroenterology for positive Cologuard.  GI visit January 14, noted some weight loss, with positive Cologuard and anemia plan for EGD and colonoscopy.  Has also been seen by hematology, plan for CT scan of chest abdomen pelvis.  Not yet performed. No questions about this plan. No nearsyncope/lightheadedness or new symptoms. On venofer.   Lab Results  Component Value Date   WBC 7.3 02/07/2023   HGB 10.6 (L) 02/07/2023   HCT 36.4 (L) 02/07/2023   MCV 71.5 (L) 02/07/2023   PLT 540 (H) 02/07/2023   Diabetes: Uncontrolled in September, prior diet controlled.  Started on insulin and metformin in October.  10 units/day lab tests as of his October 20 visit, readings were under 200 at that time. Currently taking semglee10 units.  Metformin 500mg  BID.  Out of CGM. Libre 3, no recetn home readings, but prior 130 range. No sx lows. No recent 200's.  Microalbumin: nl in 11/2022 Optho, foot exam, pneumovax:  Pneumonia vaccine declined.   Lab Results  Component Value Date   HGBA1C 14.3 (H) 12/18/2022   HGBA1C 7.3 (H) 06/13/2022   HGBA1C 6.4 12/12/2021   Lab Results  Component Value Date   MICROALBUR 1.1 12/18/2022   LDLCALC 90 12/18/2022   CREATININE 0.82 02/07/2023    Hyperlipidemia: Lipitor 20mg  every day, no se or myalgias.  Lab Results  Component Value Date   CHOL 184 12/18/2022   HDL 54.60 12/18/2022   LDLCALC 90 12/18/2022   LDLDIRECT 74.0 06/13/2022   TRIG 196.0 (H) 12/18/2022   CHOLHDL 3 12/18/2022   Lab Results  Component  Value Date   ALT 17 02/07/2023   AST 18 02/07/2023   ALKPHOS 65 02/07/2023   BILITOT 0.3 02/07/2023   Hypertension: Amlodipine 10mg  every day. No leg swelling or side effects.  Home readings: BP Readings from Last 3 Encounters:  04/17/23 136/78  04/10/23 (!) 144/86  04/07/23 136/78   Lab Results  Component Value Date   CREATININE 0.82 02/07/2023       History Patient Active Problem List   Diagnosis Date Noted   Iron deficiency anemia 02/07/2023   Hyperlipidemia 06/13/2022   Diet-controlled diabetes mellitus (HCC) 06/13/2022   Past Medical History:  Diagnosis Date   Anemia    Diabetes (HCC)    Hyperlipidemia    Hypertension    Iron deficiency anemia    No past surgical history on file. No Known Allergies Prior to Admission medications   Medication Sig Start Date End Date Taking? Authorizing Provider  amLODipine (NORVASC) 10 MG tablet Take 1 tablet (10 mg total) by mouth daily. 12/18/22  Yes Shade Flood, MD  atorvastatin (LIPITOR) 20 MG tablet Take 1 tablet (20 mg total) by mouth daily. 12/18/22  Yes Shade Flood, MD  insulin glargine (LANTUS SOLOSTAR) 100 UNIT/ML Solostar Pen Inject 10 Units into the skin daily. 12/31/22  Yes Shade Flood, MD  Insulin Pen Needle (NOVOFINE) 30G X 8 MM MISC Inject 10 each into the skin as needed. 12/31/22  Yes Shade Flood, MD  Iron, Ferrous Sulfate, 325 (65 Fe) MG TABS Take 325 mg by mouth daily. 01/02/23  Yes Shade Flood, MD  metFORMIN (GLUCOPHAGE) 500 MG tablet Take 1 tablet (500 mg total) by mouth 2 (two) times daily with a meal. Ok to start every day for few days, then increase to BID if tolerated. 12/31/22  Yes Shade Flood, MD  SEMGLEE, YFGN, 100 UNIT/ML Pen Inject 10 Units into the skin daily. 03/30/23  Yes [provider]   Social History   Socioeconomic History   Marital status: Single    Spouse name: Not on file   Number of children: 1   Years of education: 12th grade   Highest  education level: GED or equivalent  Occupational History   Occupation: [employer]  Tobacco Use   Smoking status: Never   Smokeless tobacco: Never  Vaping Use   Vaping status: Never Used  Substance and Sexual Activity   Alcohol use: Yes    Alcohol/week: 3.0 standard drinks of alcohol    Types: 3 Shots of liquor per week   Drug use: Never   Sexual activity: Yes  Other Topics Concern   Not on file  Social History Narrative   Not on file   Social Drivers of Health   Financial Resource Strain: Low Risk  (04/17/2023)   Overall Financial Resource Strain (CARDIA)    Difficulty of Paying Living Expenses: Not hard at all  Food Insecurity: No Food Insecurity (04/17/2023)   Hunger Vital Sign    Worried About Running Out of Food in the Last Year: Never true    Ran Out of Food in the Last Year: Never true  Transportation Needs: No Transportation Needs (04/17/2023)   PRAPARE - Administrator, Civil Service (Medical): No    Lack of Transportation (Non-Medical): No  Physical Activity: Inactive (04/17/2023)   Exercise Vital Sign    Days of Exercise per Week: 0 days    Minutes of Exercise per Session: 20 min  Stress: No Stress Concern Present (04/17/2023)   [employer] of Occupational Health - Occupational Stress Questionnaire    Feeling of Stress : Not at all  Social Connections: Moderately Isolated (04/17/2023)   Social Connection and Isolation Panel [NHANES]    Frequency of Communication with Friends and Family: Three times a week    Frequency of Social Gatherings with Friends and Family: Twice a week    Attends Religious Services: 1 to 4 times per year    Active Member of [employer] or Organizations: No    Attends Engineer, structural: Not on file    Marital Status: Never married  Intimate Partner Violence: Not At Risk (02/07/2023)   Humiliation, Afraid, Rape, and Kick questionnaire    Fear of Current or Ex-Partner: No    Emotionally Abused: No    Physically Abused:  No    Sexually Abused: No    Review of Systems  Constitutional:  Negative for fatigue and unexpected weight change.  Eyes:  Negative for visual disturbance.  Respiratory:  Negative for cough, chest tightness and shortness of breath.   Cardiovascular:  Negative for chest pain, palpitations and leg swelling.  Gastrointestinal:  Negative for abdominal pain and blood in stool.  Neurological:  Negative for dizziness, light-headedness and headaches.     Objective:   Vitals:   04/17/23 0909  BP: 136/78  Pulse: 92  Temp: 99.1 F (37.3 C)  TempSrc: Temporal  SpO2: 100%  Weight: 152 lb 12.8 oz (69.3 kg)  Height: 5\' 5"  (1.651 m)     Physical Exam Vitals reviewed.  Constitutional:      Appearance: He is well-developed.  HENT:     Head: Normocephalic and atraumatic.  Neck:     Vascular: No carotid bruit or JVD.  Cardiovascular:     Rate and Rhythm: Normal rate and regular rhythm.     Heart sounds: Normal heart sounds. No murmur heard. Pulmonary:     Effort: Pulmonary effort is normal.     Breath sounds: Normal breath sounds. No rales.  Musculoskeletal:     Right lower leg: No edema.     Left lower leg: No edema.  Skin:    General: Skin is warm and dry.  Neurological:     Mental Status: He is alert and oriented to person, place, and time.  Psychiatric:        Mood and Affect: Mood normal.        Assessment & Plan:  John Obrien is a 48 y.o. male . Type 2 diabetes mellitus with hyperglycemia, without long-term current use of insulin (HCC)  Hyperlipidemia, unspecified hyperlipidemia type   No orders of the defined types were placed in this encounter.  There are no Patient Instructions on file for this visit.    Signed,   Meredith Staggers, MD [facility], [facility] 04/17/23 9:45 AM

## 2023-04-17 NOTE — Patient Instructions (Signed)
Thank you for coming in today.  No medication changes at this time.  If any concerns or labs I will let you know.

## 2023-04-17 NOTE — Progress Notes (Signed)
Diagnosis: Iron Deficiency Anemia  Provider:  Chilton Greathouse MD  Procedure: IV Push  IV Type: Peripheral, IV Location: L Antecubital  Venofer (Iron Sucrose), Dose: 200 mg  Post Infusion IV Care: Observation period completed and Peripheral IV Discontinued. 15 minute observation per patient request.  Discharge: Condition: Stable, Destination: Home . AVS Declined  Performed by:  Rico Ala, LPN

## 2023-04-19 ENCOUNTER — Encounter: Payer: Self-pay | Admitting: Family Medicine

## 2023-04-20 ENCOUNTER — Other Ambulatory Visit: Payer: Self-pay

## 2023-04-20 DIAGNOSIS — D509 Iron deficiency anemia, unspecified: Secondary | ICD-10-CM

## 2023-04-21 ENCOUNTER — Inpatient Hospital Stay (HOSPITAL_BASED_OUTPATIENT_CLINIC_OR_DEPARTMENT_OTHER): Payer: BC Managed Care – PPO | Admitting: Nurse Practitioner

## 2023-04-21 ENCOUNTER — Inpatient Hospital Stay: Payer: BC Managed Care – PPO | Attending: Nurse Practitioner

## 2023-04-21 ENCOUNTER — Encounter: Payer: Self-pay | Admitting: Nurse Practitioner

## 2023-04-21 VITALS — BP 156/98 | HR 94 | Temp 98.0°F | Resp 18 | Wt 152.6 lb

## 2023-04-21 DIAGNOSIS — D509 Iron deficiency anemia, unspecified: Secondary | ICD-10-CM | POA: Insufficient documentation

## 2023-04-21 DIAGNOSIS — D500 Iron deficiency anemia secondary to blood loss (chronic): Secondary | ICD-10-CM

## 2023-04-21 LAB — CBC WITH DIFFERENTIAL (CANCER CENTER ONLY)
Abs Immature Granulocytes: 0.02 10*3/uL (ref 0.00–0.07)
Basophils Absolute: 0.1 10*3/uL (ref 0.0–0.1)
Basophils Relative: 1 %
Eosinophils Absolute: 0.6 10*3/uL — ABNORMAL HIGH (ref 0.0–0.5)
Eosinophils Relative: 9 %
HCT: 39 % (ref 39.0–52.0)
Hemoglobin: 12.3 g/dL — ABNORMAL LOW (ref 13.0–17.0)
Immature Granulocytes: 0 %
Lymphocytes Relative: 29 %
Lymphs Abs: 2 10*3/uL (ref 0.7–4.0)
MCH: 26 pg (ref 26.0–34.0)
MCHC: 31.5 g/dL (ref 30.0–36.0)
MCV: 82.5 fL (ref 80.0–100.0)
Monocytes Absolute: 0.6 10*3/uL (ref 0.1–1.0)
Monocytes Relative: 9 %
Neutro Abs: 3.6 10*3/uL (ref 1.7–7.7)
Neutrophils Relative %: 52 %
Platelet Count: 499 10*3/uL — ABNORMAL HIGH (ref 150–400)
RBC: 4.73 MIL/uL (ref 4.22–5.81)
RDW: 19.8 % — ABNORMAL HIGH (ref 11.5–15.5)
WBC Count: 6.9 10*3/uL (ref 4.0–10.5)
nRBC: 0 % (ref 0.0–0.2)

## 2023-04-21 LAB — IRON AND IRON BINDING CAPACITY (CC-WL,HP ONLY)
Iron: 113 ug/dL (ref 45–182)
Saturation Ratios: 26 % (ref 17.9–39.5)
TIBC: 434 ug/dL (ref 250–450)
UIBC: 321 ug/dL (ref 117–376)

## 2023-04-21 LAB — CMP (CANCER CENTER ONLY)
ALT: 26 U/L (ref 0–44)
AST: 26 U/L (ref 15–41)
Albumin: 4.8 g/dL (ref 3.5–5.0)
Alkaline Phosphatase: 66 U/L (ref 38–126)
Anion gap: 7 (ref 5–15)
BUN: 15 mg/dL (ref 6–20)
CO2: 28 mmol/L (ref 22–32)
Calcium: 10.3 mg/dL (ref 8.9–10.3)
Chloride: 103 mmol/L (ref 98–111)
Creatinine: 0.9 mg/dL (ref 0.61–1.24)
GFR, Estimated: >60 mL/min (ref >60)
Glucose, Bld: 109 mg/dL — ABNORMAL HIGH (ref 70–99)
Potassium: 3.8 mmol/L (ref 3.5–5.1)
Sodium: 138 mmol/L (ref 135–145)
Total Bilirubin: 0.3 mg/dL (ref 0.0–1.2)
Total Protein: 8.4 g/dL — ABNORMAL HIGH (ref 6.5–8.1)

## 2023-04-21 LAB — SAMPLE TO BLOOD BANK

## 2023-04-21 LAB — FERRITIN: Ferritin: 369 ng/mL — ABNORMAL HIGH (ref 24–336)

## 2023-04-21 NOTE — Progress Notes (Signed)
Patient Care Team: Shade Flood, MD as PCP - General (Family Medicine) [facility], [place] (Opt[facility])  Clinic Day:  04/21/2023  Referring physician: Shade Flood, MD  ASSESSMENT & PLAN:   Assessment & Plan: Iron deficiency anemia Sep 2024- Hmg 9, Ferritin 2, Iron sat 4%. Started oral iron per pcp. Today, Hemoglobin 10.6. Ferritin 20. Improved. Poor tolerance to oral iron. Continues to be symptomatic of anemia. Likely due to iron deficiency - from etiology GI blood loss/malabsorption.  Poor tolerance on oral iron.  Discussed regarding IV iron infusion/Venofer. Discussed the potential acute infusion reactions with IV iron; which are quite rare.  Patient understands the risk; will proceed with infusions. Recommend venofer x 5, weekly.   -Final scheduled dose IV Venofer on 04/17/2023.  Labs improved today.  Waiting for ferritin to result.  Additional IV iron treatments to be added as indicated. -Colonoscopy scheduled for 05/26/2023. -Continue oral iron every day. -Continue to check blood count with iron panel every 6 weeks and treat with IV iron as indicated. -Labs with office visit in 5 months.   Plan: Labs reviewed  -CBC showing WBC 6.9; Hgb 12.3; Hct 39.0; MCV 82.5; plt 499 -CMP - K 3.8; glucose 109; BUN 15; Creatinine 0.90; eGFR >60; Ca 10.3; LFTs normal.   -Iron 113; TIBC 434; sat ratio 26; UIBC 321 -Ferritin -pending Patient completed most recent IV Venofer on 04/17/2023.   Appointment for colonoscopy scheduled for 05/26/2023. Continue oral iron daily.  Will add additional IV iron treatments if ferritin continues to be low. Recheck labs every 6 weeks and treat with IV iron as needed. Labs with follow-up in 5 months.  The patient understands the plans discussed today and is in agreement with them.  He knows to contact our office if he develops concerns prior to his next appointment.  I provided 20 minutes of face-to-face time during this encounter and > 50% was spent  counseling as documented under my assessment and plan.    Carlean Jews, NP   [facility] - A DEPT [facility] [address] [place] [address] Dept: [phone] Dept Fax: [phone]   No orders of the defined types were placed in this encounter.     CHIEF COMPLAINT:  CC: iron deficiency anemia   Current Treatment:  gentle oral iron, IV venofer   INTERVAL HISTORY:  John Obrien is here today for repeat clinical assessment. Initial visit with Leotis Shames, NP on 02/07/2023. Was taking oral iron. Though improved, continued to have iron deficiency and high platelets. He did report poor tolerance of oral iron. Continued to be symptomatic for iron deficiency anemia. Was referred to GI for further work up.  Colonoscopy was scheduled for 05/26/2023.  Was also referred for IV venofer for 5 doses. His last treatment with IV Venofer was 04/17/2023.  CBC and iron panel improved.  Waiting for ferritin level.  Will add additional IV iron as indicated. Hedenies chest pain, chest pressure, or shortness of breath. He denies headaches or visual disturbances. He denies abdominal pain, nausea, vomiting, or changes in bowel or bladder habits.  States he has been tolerating oral iron well.  Denies constipation or abdominal pain.  He denies fevers or chills. He denies pain. His appetite is good. His weight has been stable.  I have reviewed the past medical history, past surgical history, social history and family history with the patient and they are unchanged from previous note.  ALLERGIES:  has no known allergies.  MEDICATIONS:  Current Outpatient Medications  Medication Sig Dispense Refill   amLODipine (NORVASC) 10 MG tablet Take 1 tablet (10 mg total) by mouth daily. 90 tablet 1   atorvastatin (LIPITOR) 20 MG tablet Take 1 tablet (20 mg total) by mouth daily. 90 tablet 1   Continuous Glucose Sensor (FREESTYLE LIBRE 3 SENSOR) MISC 1  Application by Does not apply route every 14 (fourteen) days. Place 1 sensor on the skin every 14 days. Use to check glucose continuously 6 each 1   insulin glargine (LANTUS SOLOSTAR) 100 UNIT/ML Solostar Pen Inject 10 Units into the skin daily. 15 mL PRN   Insulin Pen Needle (NOVOFINE) 30G X 8 MM MISC Inject 10 each into the skin as needed. 90 each 1   Iron, Ferrous Sulfate, 325 (65 Fe) MG TABS Take 325 mg by mouth daily. 30 tablet 5   metFORMIN (GLUCOPHAGE) 500 MG tablet Take 1 tablet (500 mg total) by mouth 2 (two) times daily with a meal. Ok to start every day for few days, then increase to BID if tolerated. 180 tablet 1   SEMGLEE, YFGN, 100 UNIT/ML Pen Inject 10 Units into the skin daily.     No current facility-administered medications for this visit.   Facility-Administered Medications Ordered in Other Visits  Medication Dose Route Frequency Provider Last Rate Last Admin   acetaminophen (TYLENOL) tablet 650 mg  650 mg Oral Once Desma Mcgregor, RPH       diphenhydrAMINE (BENADRYL) capsule 25 mg  25 mg Oral Once Desma Mcgregor, RPH       iron sucrose (VENOFER) injection 200 mg  200 mg Intravenous Once Desma Mcgregor, [facility]          REVIEW OF SYSTEMS:   Constitutional: Denies fevers, chills or abnormal weight loss Eyes: Denies blurriness of vision Ears, nose, mouth, throat, and face: Denies mucositis or sore throat Respiratory: Denies cough, dyspnea or wheezes Cardiovascular: Denies palpitation, chest discomfort or lower extremity swelling Gastrointestinal:  Denies nausea, heartburn or change in bowel habits Skin: Denies abnormal skin rashes Lymphatics: Denies new lymphadenopathy or easy bruising Neurological:Denies numbness, tingling or new weaknesses Behavioral/Psych: Mood is stable, no new changes  All other systems were reviewed with the patient and are negative.   VITALS:   Today's Vitals   04/21/23 1328  BP: (!) 156/98  Pulse: 94  Resp: 18  Temp: 98 F (36.7 C)  SpO2: 99%   Weight: 152 lb 9.6 oz (69.2 kg)  PainSc: 0-No pain   Body mass index is 25.39 kg/m.   Wt Readings from Last 3 Encounters:  04/21/23 152 lb 9.6 oz (69.2 kg)  04/17/23 156 lb 3.2 oz (70.9 kg)  04/17/23 152 lb 12.8 oz (69.3 kg)    Body mass index is 25.39 kg/m.  Performance status (ECOG): 1 - Symptomatic but completely ambulatory  PHYSICAL EXAM:   GENERAL:alert, no distress and comfortable SKIN: skin color, texture, turgor are normal, no rashes or significant lesions EYES: normal, Conjunctiva are pink and non-injected, sclera clear OROPHARYNX:no exudate, no erythema and lips, buccal mucosa, and tongue normal  NECK: supple, thyroid normal size, non-tender, without nodularity LYMPH:  no palpable lymphadenopathy in the cervical, axillary or inguinal LUNGS: clear to auscultation and percussion with normal breathing effort HEART: regular rate & rhythm and no murmurs and no lower extremity edema ABDOMEN:abdomen soft, non-tender and normal bowel sounds Musculoskeletal:no cyanosis of digits and no clubbing  NEURO: alert & oriented x 3 with  fluent speech, no focal motor/sensory deficits  LABORATORY DATA:  I have reviewed the data as listed    Component Value Date/Time   NA 138 04/21/2023 1306   NA 137 02/09/2020 1041   K 3.8 04/21/2023 1306   CL 103 04/21/2023 1306   CO2 28 04/21/2023 1306   GLUCOSE 109 (H) 04/21/2023 1306   BUN 15 04/21/2023 1306   BUN 14 02/09/2020 1041   CREATININE 0.90 04/21/2023 1306   CALCIUM 10.3 04/21/2023 1306   PROT 8.4 (H) 04/21/2023 1306   PROT 8.3 02/09/2020 1041   ALBUMIN 4.8 04/21/2023 1306   ALBUMIN 4.7 02/09/2020 1041   AST 26 04/21/2023 1306   ALT 26 04/21/2023 1306   ALKPHOS 66 04/21/2023 1306   BILITOT 0.3 04/21/2023 1306   GFRNONAA >60 04/21/2023 1306   GFRAA 124 02/09/2020 1041     Lab Results  Component Value Date   WBC 6.9 04/21/2023   NEUTROABS 3.6 04/21/2023   HGB 12.3 (L) 04/21/2023   HCT 39.0 04/21/2023   MCV 82.5  04/21/2023   PLT 499 (H) 04/21/2023

## 2023-04-21 NOTE — Assessment & Plan Note (Addendum)
Sep 2024- Hmg 9, Ferritin 2, Iron sat 4%. Started oral iron per pcp. Today, Hemoglobin 10.6. Ferritin 20. Improved. Poor tolerance to oral iron. Continues to be symptomatic of anemia. Likely due to iron deficiency - from etiology GI blood loss/malabsorption.  Poor tolerance on oral iron.  Discussed regarding IV iron infusion/Venofer. Discussed the potential acute infusion reactions with IV iron; which are quite rare.  Patient understands the risk; will proceed with infusions. Recommend venofer x 5, weekly.   -Final scheduled dose IV Venofer on 04/17/2023.  Labs improved today.  Waiting for ferritin to result.  Additional IV iron treatments to be added as indicated. -Colonoscopy scheduled for 05/26/2023. -Continue oral iron every day. -Continue to check blood count with iron panel every 6 weeks and treat with IV iron as indicated. -Labs with office visit in 5 months.

## 2023-04-22 ENCOUNTER — Telehealth: Payer: Self-pay | Admitting: Nurse Practitioner

## 2023-04-22 ENCOUNTER — Encounter: Payer: Self-pay | Admitting: Family Medicine

## 2023-04-22 NOTE — Telephone Encounter (Signed)
Left patient a message in regards to scheduled appointment times/dates; left callback number in regards to scheduled appointment

## 2023-04-24 ENCOUNTER — Ambulatory Visit: Payer: BC Managed Care – PPO | Admitting: Nurse Practitioner

## 2023-04-24 ENCOUNTER — Other Ambulatory Visit: Payer: BC Managed Care – PPO

## 2023-05-26 ENCOUNTER — Encounter: Payer: BC Managed Care – PPO | Admitting: Pediatrics

## 2023-06-01 ENCOUNTER — Other Ambulatory Visit: Payer: Self-pay

## 2023-06-01 DIAGNOSIS — D500 Iron deficiency anemia secondary to blood loss (chronic): Secondary | ICD-10-CM

## 2023-06-02 ENCOUNTER — Inpatient Hospital Stay: Payer: BC Managed Care – PPO

## 2023-06-18 ENCOUNTER — Ambulatory Visit: Payer: BC Managed Care – PPO | Admitting: Family Medicine

## 2023-07-10 ENCOUNTER — Encounter: Payer: Self-pay | Admitting: Pediatrics

## 2023-07-13 ENCOUNTER — Telehealth: Payer: Self-pay | Admitting: Nurse Practitioner

## 2023-07-13 NOTE — Telephone Encounter (Signed)
 Rescheduled appointment per the patients request. The patient is aware of the changes made.

## 2023-07-14 ENCOUNTER — Inpatient Hospital Stay: Payer: BC Managed Care – PPO

## 2023-07-19 NOTE — Progress Notes (Unsigned)
 John Obrien presented for EGD colonoscopy today and was found to have a blood glucose of 351 in preop.  States that he last took his Lantus  insulin  last night.  Upon further interview by the nurses it did not sound as though he had been checking his blood glucoses on a regular basis.  Indicated that there were insurance issues with Dexcom coverage.  Review of CMP's in epic show blood glucoses ranging from 109-323.  In discussion with anesthesia, decision was made to cancel procedures today due to elevated blood glucose and uncertain blood glucose metabolism recently.  Advised that I would message his PCP Dr.Greene to coordinate an appointment for further optimization of his diabetes management.   I advised the endoscopy staff that his procedures are time sensitive given his anemia and positive Cologuard.  Ideally would like to reschedule his procedures within 1 month if possible.

## 2023-07-20 ENCOUNTER — Encounter: Payer: Self-pay | Admitting: Pediatrics

## 2023-07-20 ENCOUNTER — Ambulatory Visit: Payer: BC Managed Care – PPO | Admitting: Pediatrics

## 2023-07-20 VITALS — BP 143/98 | HR 94 | Temp 98.0°F | Ht 65.0 in | Wt 146.4 lb

## 2023-07-20 DIAGNOSIS — D509 Iron deficiency anemia, unspecified: Secondary | ICD-10-CM

## 2023-07-20 DIAGNOSIS — R195 Other fecal abnormalities: Secondary | ICD-10-CM

## 2023-07-20 DIAGNOSIS — R634 Abnormal weight loss: Secondary | ICD-10-CM

## 2023-07-20 MED ORDER — SODIUM CHLORIDE 0.9 % IV SOLN: 500.0000 mL | Freq: Once | INTRAVENOUS | Status: DC

## 2023-07-20 NOTE — Progress Notes (Unsigned)
 Patient's blood sugar this morning on arrival was 352.  Patient states he has no pain or nausea; asymptomatic.  Dr. Yvone Herd notified and wishes to reschedule this procedure  Patient has not been regularly checking his blood sugar and insurance does not cover a monitor.  Explained the reason for this cancelation  Patient will call his PCP today; suggested he be followed by an endocrinologist before rescheduling.  Patient verbalized understanding. Patient will call back and reschedule after blood sugar is stabilized.

## 2023-07-21 ENCOUNTER — Telehealth: Payer: Self-pay | Admitting: Family Medicine

## 2023-07-21 NOTE — Telephone Encounter (Signed)
 Pt stated he doesn't need an earlier appt and to keep the time and date that he has set. He did request some diabetic needles if possible. I did state to him that he has not been seen in the time frame that was requested by his doctor to review his meds with his PCP but he is aware that I will reach out to you guys regarding his prescription request.

## 2023-07-21 NOTE — Telephone Encounter (Signed)
-----   Message from Benjiman Bras sent at 07/21/2023 10:29 AM EDT ----- Regarding: RE: [employer] patient Thank you for letting me know.  His diabetes was under much better control in January.  I do see he has an appointment May 22nd, but I will see if we can get him in sooner.  Will forward this to our admin pool to see if there are any openings for him to be seen in the next week if possible.  Thanks!  Larita Pluck ----- Message ----- From: Truddie Furrow, MD Sent: 07/20/2023   8:38 AM EDT To: Benjiman Bras, MD Subject: [facility] patient                                 Good morning Dr. Ester Helms -  I am touching base regarding a mutual patient-Mr. Odus Diles.  He presented for EGD/colonoscopy today but had a blood glucose of 351.  He indicated that he had taken his Lantus  insulin  last night.  When the nurses queried him about his glucose levels in general it did not sound that he had been following them regularly.  Anesthesia was hesitant to anesthetize him today with the elevated glucose and his procedures were canceled.  He was given instructions for resuming oral intake and administering his diabetes medications today by the staff.  In light of his anemia and positive Cologuard it will be important for us  to reschedule him for his procedure within the next month.  I wanted to see if you could coordinate an office visit with him in the near future for further optimization of his diabetes management?  Best,  Eugenia Hess [facility]

## 2023-07-21 NOTE — Telephone Encounter (Signed)
 Reached out to patient to get him in sooner but patient would like to keep his appt on 5/22

## 2023-07-27 ENCOUNTER — Telehealth: Payer: Self-pay | Admitting: Internal Medicine

## 2023-07-27 ENCOUNTER — Ambulatory Visit: Admitting: Family Medicine

## 2023-07-27 ENCOUNTER — Ambulatory Visit: Payer: BC Managed Care – PPO | Admitting: Family Medicine

## 2023-07-27 ENCOUNTER — Encounter: Payer: Self-pay | Admitting: Family Medicine

## 2023-07-27 DIAGNOSIS — E1165 Type 2 diabetes mellitus with hyperglycemia: Secondary | ICD-10-CM

## 2023-07-27 DIAGNOSIS — Z794 Long term (current) use of insulin: Secondary | ICD-10-CM

## 2023-07-27 DIAGNOSIS — Z7984 Long term (current) use of oral hypoglycemic drugs: Secondary | ICD-10-CM

## 2023-07-27 DIAGNOSIS — D509 Iron deficiency anemia, unspecified: Secondary | ICD-10-CM

## 2023-07-27 LAB — COMPREHENSIVE METABOLIC PANEL WITH GFR
ALT: 16 U/L (ref 0–53)
AST: 15 U/L (ref 0–37)
Albumin: 4.3 g/dL (ref 3.5–5.2)
Alkaline Phosphatase: 78 U/L (ref 39–117)
BUN: 12 mg/dL (ref 6–23)
CO2: 26 meq/L (ref 19–32)
Calcium: 9.2 mg/dL (ref 8.4–10.5)
Chloride: 103 meq/L (ref 96–112)
Creatinine, Ser: 0.81 mg/dL (ref 0.40–1.50)
GFR: 103.32 mL/min (ref >60.00)
Glucose, Bld: 287 mg/dL — ABNORMAL HIGH (ref 70–99)
Potassium: 3.9 meq/L (ref 3.5–5.1)
Sodium: 138 meq/L (ref 135–145)
Total Bilirubin: 0.3 mg/dL (ref 0.2–1.2)
Total Protein: 7.8 g/dL (ref 6.0–8.3)

## 2023-07-27 LAB — GLUCOSE, POCT (MANUAL RESULT ENTRY): POC Glucose: 289 mg/dL — AB (ref 70–99)

## 2023-07-27 LAB — HEMOGLOBIN A1C: Hgb A1c MFr Bld: 13 % — ABNORMAL HIGH (ref 4.6–6.5)

## 2023-07-27 MED ORDER — METFORMIN HCL ER 500 MG PO TB24: 1000.0000 mg | ORAL_TABLET | Freq: Every day | ORAL | 2 refills | Status: DC

## 2023-07-27 MED ORDER — FREESTYLE LIBRE 3 SENSOR MISC: 1.0000 | 1 refills | Status: DC

## 2023-07-27 MED ORDER — INSULIN PEN NEEDLE 30G X 8 MM MISC: 1.0000 | 1 refills | Status: AC | PRN

## 2023-07-27 NOTE — Progress Notes (Signed)
 Subjective:  Patient ID: John Obrien, male    DOB: [birth year]  Age: 48 y.o. MRN: [id number]  CC:  Chief Complaint  Patient presents with   Medical Management of Chronic Issues    Pt is well, no concerns     HPI John Obrien presents for   Diabetes: Complicated by hyperglycemia, uncontrolled in September.  Significantly improved control at his January visit with A1c of 6.8.  Readings have been in the low to mid 100s at that time on 10 units of Semglee and metformin  500 mg twice daily.  Unfortunately recently was noted to have much higher blood sugar when planned colonoscopy on April 28.  Blood glucose of 351.  Anesthesia was hesitant to anesthetize him with that elevated glucose and his procedure was canceled.  Plan for rescheduling procedure within the next month given his history of anemia and positive Cologuard.  Initial plan to follow-up with me for improved management of diabetes. Has not yet rescheduled study.   Currently taking semglee 10 units/day.  No missed doses, but did not take any insulin  the day of colonoscopy - had been recommended to take 1/2 dose.  Had to stop metformin  prior to procedure. Usually metformin  500 mg BID. He is on statin with Lipitor 20 mg daily,  No recent home readings - needs rx for CGM. Unsure of coverage for CGM.  No n/v/abd pain, no change in thirst or urination. No blurry vision.  Slight upset stomach with metformin  - some diarrhea. Better with food.   Lab Results  Component Value Date   HGBA1C 6.8 (H) 04/17/2023   HGBA1C 14.3 (H) 12/18/2022   HGBA1C 7.3 (H) 06/13/2022   Lab Results  Component Value Date   MICROALBUR 1.1 12/18/2022   LDLCALC 95 04/17/2023   CREATININE 0.90 04/21/2023    History Patient Active Problem List   Diagnosis Date Noted   Iron  deficiency anemia 02/07/2023   Hyperlipidemia 06/13/2022   Diet-controlled diabetes mellitus (HCC) 06/13/2022   Past Medical History:  Diagnosis Date   Anemia    Diabetes  (HCC)    Hyperlipidemia    Hypertension    Iron  deficiency anemia    No past surgical history on file. No Known Allergies Prior to Admission medications   Medication Sig Start Date End Date Taking? Authorizing Provider  amLODipine  (NORVASC ) 10 MG tablet Take 1 tablet (10 mg total) by mouth daily. 04/17/23  Yes Benjiman Bras, MD  atorvastatin  (LIPITOR) 20 MG tablet Take 1 tablet (20 mg total) by mouth daily. 04/17/23  Yes Benjiman Bras, MD  Continuous Glucose Sensor (FREESTYLE LIBRE 3 SENSOR) MISC 1 Application by Does not apply route every 14 (fourteen) days. Place 1 sensor on the skin every 14 days. Use to check glucose continuously 04/17/23  Yes Benjiman Bras, MD  insulin  glargine (LANTUS  SOLOSTAR) 100 UNIT/ML Solostar Pen Inject 10 Units into the skin daily. 12/31/22  Yes Benjiman Bras, MD  Insulin  Pen Needle (NOVOFINE) 30G X 8 MM MISC Inject 10 each into the skin as needed. 12/31/22  Yes Benjiman Bras, MD  Iron , Ferrous Sulfate , 325 (65 Fe) MG TABS Take 325 mg by mouth daily. 01/02/23  Yes Benjiman Bras, MD  metFORMIN  (GLUCOPHAGE ) 500 MG tablet Take 1 tablet (500 mg total) by mouth 2 (two) times daily with a meal. Ok to start every day for few days, then increase to BID if tolerated. 04/17/23  Yes Benjiman Bras, MD  SEMGLEE, YFGN, 100  UNIT/ML Pen Inject 10 Units into the skin daily. 03/30/23  Yes [provider]   Social History   Socioeconomic History   Marital status: Single    Spouse name: Not on file   Number of children: 1   Years of education: 12th grade   Highest education level: GED or equivalent  Occupational History   Occupation: [employer]  Tobacco Use   Smoking status: Never   Smokeless tobacco: Never  Vaping Use   Vaping status: Never Used  Substance and Sexual Activity   Alcohol use: Yes    Alcohol/week: 3.0 standard drinks of alcohol    Types: 3 Shots of liquor per week    Comment: 3 times per week   Drug use: Never   Sexual  activity: Yes  Other Topics Concern   Not on file  Social History Narrative   Not on file   Social Drivers of Health   Financial Resource Strain: Low Risk  (04/17/2023)   Overall Financial Resource Strain (CARDIA)    Difficulty of Paying Living Expenses: Not hard at all  Food Insecurity: No Food Insecurity (04/17/2023)   Hunger Vital Sign    Worried About Running Out of Food in the Last Year: Never true    Ran Out of Food in the Last Year: Never true  Transportation Needs: No Transportation Needs (04/17/2023)   PRAPARE - Administrator, Civil Service (Medical): No    Lack of Transportation (Non-Medical): No  Physical Activity: Inactive (04/17/2023)   Exercise Vital Sign    Days of Exercise per Week: 0 days    Minutes of Exercise per Session: 20 min  Stress: No Stress Concern Present (04/17/2023)   [employer] of Occupational Health - Occupational Stress Questionnaire    Feeling of Stress : Not at all  Social Connections: Moderately Isolated (04/17/2023)   Social Connection and Isolation Panel [NHANES]    Frequency of Communication with Friends and Family: Three times a week    Frequency of Social Gatherings with Friends and Family: Twice a week    Attends Religious Services: 1 to 4 times per year    Active Member of [employer] or Organizations: No    Attends Engineer, structural: Not on file    Marital Status: Never married  Intimate Partner Violence: Not At Risk (02/07/2023)   Humiliation, Afraid, Rape, and Kick questionnaire    Fear of Current or Ex-Partner: No    Emotionally Abused: No    Physically Abused: No    Sexually Abused: No    Review of Systems  Constitutional:  Negative for fatigue and unexpected weight change.  Eyes:  Negative for visual disturbance.  Respiratory:  Negative for cough, chest tightness and shortness of breath.   Cardiovascular:  Negative for chest pain, palpitations and leg swelling.  Gastrointestinal:  Negative for abdominal  pain and blood in stool.  Neurological:  Negative for dizziness, light-headedness and headaches.     Objective:   Vitals:   07/27/23 0954  BP: 128/60  Pulse: 81  Temp: 98.2 F (36.8 C)  TempSrc: Temporal  SpO2: 100%  Weight: 151 lb 6.4 oz (68.7 kg)  Height: 5\' 5"  (1.651 m)     Physical Exam Vitals reviewed.  Constitutional:      Appearance: He is well-developed.  HENT:     Head: Normocephalic and atraumatic.  Neck:     Vascular: No carotid bruit or JVD.  Cardiovascular:     Rate and Rhythm: Normal  rate and regular rhythm.     Heart sounds: Normal heart sounds. No murmur heard. Pulmonary:     Effort: Pulmonary effort is normal.     Breath sounds: Normal breath sounds. No rales.  Musculoskeletal:     Right lower leg: No edema.     Left lower leg: No edema.  Skin:    General: Skin is warm and dry.  Neurological:     Mental Status: He is alert and oriented to person, place, and time.  Psychiatric:        Mood and Affect: Mood normal.        Assessment & Plan:  Taequan E Gonder is a 48 y.o. male . Microcytic anemia - Plan: Insulin  Pen Needle (NOVOFINE) 30G X 8 MM MISC  Type 2 diabetes mellitus with hyperglycemia, without long-term current use of insulin  (HCC) - Plan: Continuous Glucose Sensor (FREESTYLE LIBRE 3 SENSOR) MISC, metFORMIN  (GLUCOPHAGE -XR) 500 MG 24 hr tablet, POCT glucose (manual entry), Comprehensive metabolic panel with GFR, Hemoglobin A1c Uncontrolled diabetes at time of colonoscopy as above.  May have been related to no use of insulin  day of procedure and had stopped metformin  for that procedure.  Misunderstanding to use half dose of insulin .  Since that time he has returned to 10 units of Semglee daily as well as metformin  500 mg twice daily with some gastrointestinal side effects.  Denies any symptoms of hyperglycemia but unfortunately no recent home readings.  - Changed to extended release metformin  for suspected improved gastrointestinal  tolerability.  RTC precautions.  - Continue tenderness of insulin  for now, check point-of-care glucose, A1c, CMP and if improved control will continue same regimen and have him call gastroenterology for rescheduling of colonoscopy.  Will need to take half dose of insulin  prior to that procedure for improved management of glycemic control day of.    Meds ordered this encounter  Medications   Insulin  Pen Needle (NOVOFINE) 30G X 8 MM MISC    Sig: Inject 10 each into the skin as needed.    Dispense:  90 each    Refill:  1   Continuous Glucose Sensor (FREESTYLE LIBRE 3 SENSOR) MISC    Sig: 1 Application by Does not apply route every 14 (fourteen) days. Place 1 sensor on the skin every 14 days. Use to check glucose continuously    Dispense:  6 each    Refill:  1   metFORMIN  (GLUCOPHAGE -XR) 500 MG 24 hr tablet    Sig: Take 2 tablets (1,000 mg total) by mouth daily with breakfast.    Dispense:  60 tablet    Refill:  2   Patient Instructions  I suspect the high glucose at your colonoscopy may have been from no insulin  that day, but try to check some readings with a new CGM (let me know if it is not covered and we will look into options- should have some coverage since you are on insulin ). Let me know thaose readings in the next week.   Try extended release metformin  - should have less stomach side effects.   Thanks for coming in today.  I will set up follow-up in 3 months, but that may change depending on the labs from today.  Also, call gastroenterology to reschedule the colonoscopy as long as the blood sugars are in better control.  Take care!    Signed,   Caro Christmas, MD  Primary Care, [facility] 07/27/23 10:52 AM

## 2023-07-27 NOTE — Patient Instructions (Addendum)
 I suspect the high glucose at your colonoscopy may have been from no insulin  that day, but try to check some readings with a new CGM (let me know if it is not covered and we will look into options- should have some coverage since you are on insulin ). Let me know thaose readings in the next week.   Try extended release metformin  - should have less stomach side effects.   Thanks for coming in today.  I will set up follow-up in 3 months, but that may change depending on the labs from today.  Also, call gastroenterology to reschedule the colonoscopy as long as the blood sugars are in better control.  Take care!

## 2023-07-27 NOTE — Telephone Encounter (Signed)
 Patient called to cancel upcoming lab appointment and said he would call back to reschedule. The patient is aware of the changes made.

## 2023-07-28 ENCOUNTER — Encounter: Payer: Self-pay | Admitting: Family Medicine

## 2023-07-28 DIAGNOSIS — D509 Iron deficiency anemia, unspecified: Secondary | ICD-10-CM

## 2023-07-28 NOTE — Telephone Encounter (Signed)
 Please call this patient and schedule him with Dr.Greene in 1 week please for recheck diabetes/medication adjustment

## 2023-07-29 ENCOUNTER — Inpatient Hospital Stay

## 2023-07-29 ENCOUNTER — Telehealth: Payer: Self-pay

## 2023-07-29 ENCOUNTER — Other Ambulatory Visit (HOSPITAL_COMMUNITY): Payer: Self-pay

## 2023-07-29 DIAGNOSIS — E1165 Type 2 diabetes mellitus with hyperglycemia: Secondary | ICD-10-CM

## 2023-07-29 MED ORDER — DEXCOM G6 SENSOR MISC: 3 refills | Status: DC

## 2023-07-29 NOTE — Telephone Encounter (Signed)
 Copied from CRM [id number]. Topic: Clinical - Medication Question >> Jul 29, 2023 10:45 AM John Obrien wrote: Reason for CRM: PT STATED INSURANCE IS NOT PAYING FOR THE GLUCOSE READER AND WANTED TO KNOW WHAT OPTIONS ARE AVAILABLE

## 2023-07-29 NOTE — Telephone Encounter (Signed)
 LVM to schedule pt.

## 2023-07-29 NOTE — Telephone Encounter (Signed)
 Can a test claim be run on the [place] or Dexcom? Or better to send to the pharmacy and process that way.

## 2023-07-29 NOTE — Telephone Encounter (Signed)
 Ordered Dexcom G6

## 2023-07-29 NOTE — Telephone Encounter (Signed)
 Dexcom appears to be the preferred under insurance

## 2023-07-29 NOTE — Telephone Encounter (Signed)
 Discussed with him at his office visit, please clarify if it is just not covering freestyle libre 3, or any CGM.  Please check to see if Dexcom or other continuous glucose monitors would be covered as he is on insulin .

## 2023-07-29 NOTE — Telephone Encounter (Signed)
 Patient is questioning what options are available due to insurance not covering glucose reader?

## 2023-07-30 NOTE — Telephone Encounter (Signed)
 Patient states insurance will cover Dexcom.

## 2023-08-03 ENCOUNTER — Telehealth: Payer: Self-pay | Admitting: Family Medicine

## 2023-08-03 MED ORDER — DEXCOM G6 RECEIVER DEVI: 1.0000 | Freq: Every day | 0 refills | Status: DC

## 2023-08-03 NOTE — Telephone Encounter (Signed)
 Confirmed with patient he needed the reader, sent in to pharmacy

## 2023-08-03 NOTE — Telephone Encounter (Signed)
 Copied from CRM [id number]. Topic: Clinical - Prescription Issue >> Aug 03, 2023 12:13 PM Jenice Mitts wrote: Reason for CRM: Patient is calling in because he received everything for his insulin  except the transmitter

## 2023-08-06 ENCOUNTER — Ambulatory Visit: Admitting: Family Medicine

## 2023-08-06 ENCOUNTER — Encounter: Payer: Self-pay | Admitting: Family Medicine

## 2023-08-06 VITALS — BP 136/82 | HR 79 | Temp 98.1°F | Ht 65.0 in | Wt 151.2 lb

## 2023-08-06 DIAGNOSIS — Z7984 Long term (current) use of oral hypoglycemic drugs: Secondary | ICD-10-CM | POA: Diagnosis not present

## 2023-08-06 DIAGNOSIS — E1165 Type 2 diabetes mellitus with hyperglycemia: Secondary | ICD-10-CM | POA: Diagnosis not present

## 2023-08-06 LAB — GLUCOSE, POCT (MANUAL RESULT ENTRY): POC Glucose: 262 mg/dL — AB (ref 70–99)

## 2023-08-06 MED ORDER — LANCET DEVICE MISC: 0 refills | Status: AC

## 2023-08-06 MED ORDER — BLOOD GLUCOSE TEST VI STRP: ORAL_STRIP | 0 refills | Status: DC

## 2023-08-06 MED ORDER — BLOOD GLUCOSE MONITORING SUPPL DEVI: 0 refills | Status: AC

## 2023-08-06 MED ORDER — LANCETS MISC. MISC: 0 refills | Status: AC

## 2023-08-06 NOTE — Patient Instructions (Addendum)
 Thanks for coming in today.  Blood sugar 262 in the office today.  Increase Semglee  to 20 units/day for now. You should not need to buy a receiver for the continuous glucose monitor as I think you would run into the same issue with the SN number.  I recommend taking your meter to the pharmacy to have them help you identify the SN number or assist with device connection, but let me know if they are not able to help and we can find a way to look into options. For now I also prescribed a traditional glucose meter.  If any low blood sugars, let me know, but for now check your blood sugar fasting and either 2 hours after breakfast lunch or dinner.  2 readings per day.  Send me those readings through MyChart in the next 1 week.  Follow-up visit in 2 weeks.

## 2023-08-06 NOTE — Progress Notes (Signed)
 Subjective:  Patient ID: John Obrien, male    DOB: Aug 03, 1975  Age: 48 y.o. MRN: [id number]  CC:  Chief Complaint  Patient presents with   Results    Discuss results of labs   Medical Management of Chronic Issues    pt notes no questions      HPI John Obrien presents for   Diabetes:  Complicated by hyperglycemia.  Recently noted to be uncontrolled at time of his scheduled colonoscopy.  Initially thought to have been due to no insulin  on day of procedure and has stopped metformin  for that procedure.  However with his A1c of 13.0 suspect progressive worsening uncontrolled diabetes.  He had been back to 10 units of Semglee daily as well as metformin  500 mg twice daily but we changed that to extended release formulation to lessen intestinal side effects.  CGM also ordered since last visit and increased his semglee to 15 units since last visit.  XR formulation of metformin  with less GI issues - much improved.  On semglee as above daily - no missed doses.  Cgm cost prohibitive for receiver. Trouble finding SN number to input. He does have smart phone.  No home readings - no device.  Freestyle libre not covered.  No n/v/abd pain/blurry vision, confusion/HA.   Microalbumin: Normal ratio 12/18/2022 Optho, foot exam, pneumovax:  Pneumonia vaccine discussed - declined.   Lab Results  Component Value Date   HGBA1C 13.0 (H) 07/27/2023   HGBA1C 6.8 (H) 04/17/2023   HGBA1C 14.3 (H) 12/18/2022   Lab Results  Component Value Date   MICROALBUR 1.1 12/18/2022   LDLCALC 95 04/17/2023   CREATININE 0.81 07/27/2023    History Patient Active Problem List   Diagnosis Date Noted   Iron  deficiency anemia 02/07/2023   Hyperlipidemia 06/13/2022   Diet-controlled diabetes mellitus (HCC) 06/13/2022   Past Medical History:  Diagnosis Date   Anemia    Diabetes (HCC)    Hyperlipidemia    Hypertension    Iron  deficiency anemia    No past surgical history on file. No Known  Allergies Prior to Admission medications   Medication Sig Start Date End Date Taking? Authorizing Provider  amLODipine  (NORVASC ) 10 MG tablet Take 1 tablet (10 mg total) by mouth daily. 04/17/23   Benjiman Bras, MD  atorvastatin  (LIPITOR) 20 MG tablet Take 1 tablet (20 mg total) by mouth daily. 04/17/23   Benjiman Bras, MD  Continuous Glucose Receiver (DEXCOM G6 RECEIVER) DEVI 1 Device by Does not apply route daily. 08/03/23   Benjiman Bras, MD  Continuous Glucose Sensor (DEXCOM G6 SENSOR) MISC Apply to back of upper arm, change every 10 days. 07/29/23   Benjiman Bras, MD  insulin  glargine (LANTUS  SOLOSTAR) 100 UNIT/ML Solostar Pen Inject 10 Units into the skin daily. 12/31/22   Benjiman Bras, MD  Insulin  Pen Needle (NOVOFINE) 30G X 8 MM MISC Inject 10 each into the skin as needed. 07/27/23   Benjiman Bras, MD  Iron , Ferrous Sulfate , 325 (65 Fe) MG TABS Take 325 mg by mouth daily. 01/02/23   Benjiman Bras, MD  metFORMIN  (GLUCOPHAGE -XR) 500 MG 24 hr tablet Take 2 tablets (1,000 mg total) by mouth daily with breakfast. 07/27/23   Benjiman Bras, MD  SEMGLEE, YFGN, 100 UNIT/ML Pen Inject 10 Units into the skin daily. 03/30/23   [provider]   Social History   Socioeconomic History   Marital status: Single    Spouse name:  Not on file   Number of children: 1   Years of education: 12th grade   Highest education level: GED or equivalent  Occupational History   Occupation: [employer]  Tobacco Use   Smoking status: Never   Smokeless tobacco: Never  Vaping Use   Vaping status: Never Used  Substance and Sexual Activity   Alcohol use: Yes    Alcohol/week: 3.0 standard drinks of alcohol    Types: 3 Shots of liquor per week    Comment: 3 times per week   Drug use: Never   Sexual activity: Yes  Other Topics Concern   Not on file  Social History Narrative   Not on file   Social Drivers of Health   Financial Resource Strain: Low Risk  (04/17/2023)   Overall  Financial Resource Strain (CARDIA)    Difficulty of Paying Living Expenses: Not hard at all  Food Insecurity: No Food Insecurity (04/17/2023)   Hunger Vital Sign    Worried About Running Out of Food in the Last Year: Never true    Ran Out of Food in the Last Year: Never true  Transportation Needs: No Transportation Needs (04/17/2023)   PRAPARE - Administrator, Civil Service (Medical): No    Lack of Transportation (Non-Medical): No  Physical Activity: Inactive (04/17/2023)   Exercise Vital Sign    Days of Exercise per Week: 0 days    Minutes of Exercise per Session: 20 min  Stress: No Stress Concern Present (04/17/2023)   [employer] of Occupational Health - Occupational Stress Questionnaire    Feeling of Stress : Not at all  Social Connections: Moderately Isolated (04/17/2023)   Social Connection and Isolation Panel [NHANES]    Frequency of Communication with Friends and Family: Three times a week    Frequency of Social Gatherings with Friends and Family: Twice a week    Attends Religious Services: 1 to 4 times per year    Active Member of [employer] or Organizations: No    Attends Engineer, structural: Not on file    Marital Status: Never married  Intimate Partner Violence: Not At Risk (02/07/2023)   Humiliation, Afraid, Rape, and Kick questionnaire    Fear of Current or Ex-Partner: No    Emotionally Abused: No    Physically Abused: No    Sexually Abused: No    Review of Systems  Per hpi.  Objective:   Vitals:   08/06/23 0810  BP: 136/82  Pulse: 79  Temp: 98.1 F (36.7 C)  TempSrc: Temporal  SpO2: 98%  Weight: 151 lb 3.2 oz (68.6 kg)  Height: 5\' 5"  (1.651 m)     Physical Exam Vitals reviewed.  Constitutional:      Appearance: He is well-developed.  HENT:     Head: Normocephalic and atraumatic.  Neck:     Vascular: No carotid bruit or JVD.  Cardiovascular:     Rate and Rhythm: Normal rate and regular rhythm.     Heart sounds: Normal heart  sounds. No murmur heard. Pulmonary:     Effort: Pulmonary effort is normal.     Breath sounds: Normal breath sounds. No rales.  Musculoskeletal:     Right lower leg: No edema.     Left lower leg: No edema.  Skin:    General: Skin is warm and dry.  Neurological:     Mental Status: He is alert and oriented to person, place, and time.  Psychiatric:  Mood and Affect: Mood normal.       Results for orders placed or performed in visit on 08/06/23  POCT glucose (manual entry)   Collection Time: 08/06/23  8:33 AM  Result Value Ref Range   POC Glucose 262 (A) 70 - 99 mg/dl    Assessment & Plan:  John Obrien is a 48 y.o. male . Type 2 diabetes mellitus with hyperglycemia, without long-term current use of insulin  (HCC) - Plan: POCT glucose (manual entry), Blood Glucose Monitoring Suppl DEVI, Glucose Blood (BLOOD GLUCOSE TEST STRIPS) STRP, Lancet Device MISC, Lancets Misc. MISC Uncontrolled, with minimal change in glycemic control based on in office testing today at 15 units Semglee.  Will increase to 20 units for now.  Some difficulty with connecting his continuous glucose monitor.  SN number apparently is the issue, so should not need receiver as he would run into the same problem.  Asked that he take that receiver by the pharmacy to see if they can identify the SN number, but if continued issues may need to look at other monitor or discuss further with Dexcom.  I did prescribe a traditional meter for now to monitor home readings with update in the next 1 week by MyChart, 2-week in office eval.  Tolerating metformin  extended release, continue same.  Meds ordered this encounter  Medications   Blood Glucose Monitoring Suppl DEVI    Sig: May substitute to any manufacturer covered by patient's insurance.  2 times per day.  Insulin -dependent    Dispense:  1 each    Refill:  0   Glucose Blood (BLOOD GLUCOSE TEST STRIPS) STRP    Sig: May substitute to any manufacturer covered by  patient's insurance.  2 times per day.  Insulin -dependent    Dispense:  100 strip    Refill:  0   Lancet Device MISC    Sig: May substitute to any manufacturer covered by patient's insurance.  2 times per day.  Insulin -dependent.    Dispense:  1 each    Refill:  0   Lancets Misc. MISC    Sig: May substitute to any manufacturer covered by patient's insurance.  2 times per day, insulin -dependent    Dispense:  100 each    Refill:  0   Patient Instructions  Thanks for coming in today.  Blood sugar 262 in the office today.  Increase Semglee  to 20 units/day for now. You should not need to buy a receiver for the continuous glucose monitor as I think you would run into the same issue with the SN number.  I recommend taking your meter to the pharmacy to have them help you identify the SN number or assist with device connection, but let me know if they are not able to help and we can find a way to look into options. For now I also prescribed a traditional glucose meter.  If any low blood sugars, let me know, but for now check your blood sugar fasting and either 2 hours after breakfast lunch or dinner.  2 readings per day.  Send me those readings through MyChart in the next 1 week.  Follow-up visit in 2 weeks.        Signed,   Caro Christmas, MD [facility], [facility] 08/06/23 8:38 AM

## 2023-08-11 NOTE — Telephone Encounter (Signed)
 Patient notes taking 20 units so does need an updated Rx to reflect this. Pended medication below if you'll just check the directions are correct that would be appreciated

## 2023-08-12 ENCOUNTER — Other Ambulatory Visit: Payer: Self-pay | Admitting: Family Medicine

## 2023-08-12 MED ORDER — INSULIN GLARGINE-YFGN 100 UNIT/ML ~~LOC~~ SOPN: 20.0000 [IU] | PEN_INJECTOR | Freq: Every day | SUBCUTANEOUS | 1 refills | Status: DC

## 2023-08-12 NOTE — Progress Notes (Signed)
 Semglee reordered at 20 units.

## 2023-08-13 ENCOUNTER — Ambulatory Visit: Admitting: Family Medicine

## 2023-08-25 ENCOUNTER — Inpatient Hospital Stay: Payer: BC Managed Care – PPO | Attending: Nurse Practitioner

## 2023-08-26 ENCOUNTER — Encounter: Payer: Self-pay | Admitting: Family Medicine

## 2023-08-26 ENCOUNTER — Ambulatory Visit: Admitting: Family Medicine

## 2023-08-26 DIAGNOSIS — E1165 Type 2 diabetes mellitus with hyperglycemia: Secondary | ICD-10-CM

## 2023-08-26 DIAGNOSIS — Z794 Long term (current) use of insulin: Secondary | ICD-10-CM

## 2023-08-26 LAB — GLUCOSE, POCT (MANUAL RESULT ENTRY): POC Glucose: 94 mg/dL (ref 70–99)

## 2023-08-26 MED ORDER — DEXCOM G6 SENSOR MISC: 3 refills | Status: DC

## 2023-08-26 NOTE — Patient Instructions (Addendum)
 I sent in the continuous glucose monitor to [facility] [employer].  Blood sugar much better in the office today.  Lets back off the 15 units of insulin  for now and keep an eye on your blood sugars at home.  Give me an update in the next few days.  If any readings below 100, decrease by an additional 5 units until we talk.  No change in metformin  for now.  Recheck in 1 month.

## 2023-08-26 NOTE — Progress Notes (Unsigned)
 Subjective:  Patient ID: John Obrien, male    DOB: Feb 16, 1976  Age: 48 y.o. MRN: [id number]  CC:  Chief Complaint  Patient presents with   Diabetes    Follow up; doing well; just need to know how to work new pen    HPI John Obrien presents for   Diabetes: Complicated by hyperglycemia.  Uncontrolled with most recent A1c in May at 13, had been controlled previously with A1c of 6.8 back in January.  Suspected progressive worsening of his diabetes.  Has been started on Semglee  insulin , 10 units initially, then 15 units as of his last visit on May 15.  metformin  changed to extended release formulation to improve intestinal side effects.  Difficulty with CGM link discussed at his May 15 visit.  Unknown home readings. Glucose 262 in the office on last visit.  Basal insulin  increased to 20 units.   Unable to get CGM to work.   New rx needed for [facility] pharmacy Has fingerstick device - trouble using.  No home readings recently.  143, 124 by family member's device  1 week ago no recent readings.  No low blood sugar symptoms.  XR metformin  working well.  Feeling better. No n/v/abd pain, no blurry vision.   Lab Results  Component Value Date   HGBA1C 13.0 (H) 07/27/2023   HGBA1C 6.8 (H) 04/17/2023   HGBA1C 14.3 (H) 12/18/2022   Lab Results  Component Value Date   MICROALBUR 1.1 12/18/2022   LDLCALC 95 04/17/2023   CREATININE 0.81 07/27/2023      History Patient Active Problem List   Diagnosis Date Noted   Iron  deficiency anemia 02/07/2023   Hyperlipidemia 06/13/2022   Diet-controlled diabetes mellitus (HCC) 06/13/2022   Past Medical History:  Diagnosis Date   Anemia    Diabetes (HCC)    Hyperlipidemia    Hypertension    Iron  deficiency anemia    History reviewed. No pertinent surgical history. No Known Allergies Prior to Admission medications   Medication Sig Start Date End Date Taking? Authorizing Provider  amLODipine  (NORVASC ) 10 MG tablet Take 1 tablet  (10 mg total) by mouth daily. 04/17/23  Yes Benjiman Bras, MD  atorvastatin  (LIPITOR) 20 MG tablet Take 1 tablet (20 mg total) by mouth daily. 04/17/23  Yes Benjiman Bras, MD  Blood Glucose Monitoring Suppl DEVI May substitute to any manufacturer covered by patient's insurance.  2 times per day.  Insulin -dependent 08/06/23  Yes Benjiman Bras, MD  Continuous Glucose Sensor (DEXCOM G6 SENSOR) MISC Apply to back of upper arm, change every 10 days. 07/29/23  Yes Benjiman Bras, MD  Glucose Blood (BLOOD GLUCOSE TEST STRIPS) STRP May substitute to any manufacturer covered by patient's insurance.  2 times per day.  Insulin -dependent 08/06/23  Yes Benjiman Bras, MD  insulin  glargine-yfgn (SEMGLEE ) 100 UNIT/ML Pen Inject 20 Units into the skin daily. Total daily dose = 0.2-0.5 units/kg/day---> 50% Basal/50% Bolus 08/12/23  Yes Benjiman Bras, MD  Insulin  Pen Needle (NOVOFINE) 30G X 8 MM MISC Inject 10 each into the skin as needed. 07/27/23  Yes Benjiman Bras, MD  Iron , Ferrous Sulfate , 325 (65 Fe) MG TABS Take 325 mg by mouth daily. 01/02/23  Yes Benjiman Bras, MD  Lancet Device MISC May substitute to any manufacturer covered by patient's insurance.  2 times per day.  Insulin -dependent. 08/06/23  Yes Benjiman Bras, MD  Lancets Misc. MISC May substitute to any manufacturer covered by patient's insurance.  2 times per day, insulin -dependent 08/06/23  Yes Benjiman Bras, MD  metFORMIN  (GLUCOPHAGE -XR) 500 MG 24 hr tablet Take 2 tablets (1,000 mg total) by mouth daily with breakfast. 07/27/23  Yes Benjiman Bras, MD   Social History   Socioeconomic History   Marital status: Single    Spouse name: Not on file   Number of children: 1   Years of education: 12th grade   Highest education level: GED or equivalent  Occupational History   Occupation: [employer]  Tobacco Use   Smoking status: Never   Smokeless tobacco: Never  Vaping Use   Vaping status: Never Used  Substance and Sexual  Activity   Alcohol use: Yes    Alcohol/week: 3.0 standard drinks of alcohol    Types: 3 Shots of liquor per week    Comment: 3 times per week   Drug use: Never   Sexual activity: Yes  Other Topics Concern   Not on file  Social History Narrative   Not on file   Social Drivers of Health   Financial Resource Strain: Low Risk  (04/17/2023)   Overall Financial Resource Strain (CARDIA)    Difficulty of Paying Living Expenses: Not hard at all  Food Insecurity: No Food Insecurity (04/17/2023)   Hunger Vital Sign    Worried About Running Out of Food in the Last Year: Never true    Ran Out of Food in the Last Year: Never true  Transportation Needs: No Transportation Needs (04/17/2023)   PRAPARE - Administrator, Civil Service (Medical): No    Lack of Transportation (Non-Medical): No  Physical Activity: Inactive (04/17/2023)   Exercise Vital Sign    Days of Exercise per Week: 0 days    Minutes of Exercise per Session: 20 min  Stress: No Stress Concern Present (04/17/2023)   [employer] of Occupational Health - Occupational Stress Questionnaire    Feeling of Stress : Not at all  Social Connections: Moderately Isolated (04/17/2023)   Social Connection and Isolation Panel [NHANES]    Frequency of Communication with Friends and Family: Three times a week    Frequency of Social Gatherings with Friends and Family: Twice a week    Attends Religious Services: 1 to 4 times per year    Active Member of [employer] or Organizations: No    Attends Engineer, structural: Not on file    Marital Status: Never married  Intimate Partner Violence: Not At Risk (02/07/2023)   Humiliation, Afraid, Rape, and Kick questionnaire    Fear of Current or Ex-Partner: No    Emotionally Abused: No    Physically Abused: No    Sexually Abused: No    Review of Systems   Objective:   Vitals:   08/26/23 1506  BP: 134/78  Pulse: 85  Temp: 98.2 F (36.8 C)  SpO2: 96%  Weight: 154 lb (69.9 kg)   Height: 5\' 5"  (1.651 m)     Physical Exam Vitals reviewed.  Constitutional:      Appearance: He is well-developed.  HENT:     Head: Normocephalic and atraumatic.  Neck:     Vascular: No carotid bruit or JVD.  Cardiovascular:     Rate and Rhythm: Normal rate and regular rhythm.     Heart sounds: Normal heart sounds. No murmur heard. Pulmonary:     Effort: Pulmonary effort is normal.     Breath sounds: Normal breath sounds. No rales.  Musculoskeletal:     Right lower  leg: No edema.     Left lower leg: No edema.  Skin:    General: Skin is warm and dry.  Neurological:     Mental Status: He is alert and oriented to person, place, and time.  Psychiatric:        Mood and Affect: Mood normal.       Results for orders placed or performed in visit on 08/26/23  POCT glucose (manual entry)   Collection Time: 08/26/23  4:20 PM  Result Value Ref Range   POC Glucose 11.8 (A) 70 - 99 mg/dl   Patient's device 98.   In office poct glucose 94.  Assessment & Plan:  Koda E Welsch is a 48 y.o. male . Type 2 diabetes mellitus with hyperglycemia, with long-term current use of insulin  (HCC) - Plan: Continuous Glucose Sensor (DEXCOM G6 SENSOR) MISC, POCT glucose (manual entry) Significantly improved.  Will back off insulin  dosing, continue home monitoring.  Option to decrease by another 5 units if any persistent low readings.  Hypoglycemic precautions discussed.  Teaching for his glucometer performed.  New Dexcom ordered.  Continue metformin ,   1 month follow-up.  Meds ordered this encounter  Medications   Continuous Glucose Sensor (DEXCOM G6 SENSOR) MISC    Sig: Apply to back of upper arm, change every 10 days.    Dispense:  3 each    Refill:  3   Patient Instructions  I sent in the continuous glucose monitor to [facility] [employer].  Blood sugar much better in the office today.  Lets back off the 15 units of insulin  for now and keep an eye on your blood sugars at home.  Give me an  update in the next few days.  If any readings below 100, decrease by an additional 5 units until we talk.  No change in metformin  for now.  Recheck in 1 month.        Signed,   Caro Christmas, MD [facility], [facility] 08/26/23 4:32 PM

## 2023-08-31 ENCOUNTER — Encounter: Payer: Self-pay | Admitting: Family Medicine

## 2023-09-01 MED ORDER — DEXCOM G6 TRANSMITTER MISC: 0 refills | Status: DC

## 2023-09-13 ENCOUNTER — Other Ambulatory Visit: Payer: Self-pay | Admitting: Family Medicine

## 2023-09-16 ENCOUNTER — Ambulatory Visit: Admitting: Family Medicine

## 2023-09-23 ENCOUNTER — Telehealth: Payer: Self-pay | Admitting: Pharmacist

## 2023-09-23 NOTE — Telephone Encounter (Signed)
 Attempt was made to contact patient by phone today by Clinical Pharmacist regarding Continuous Glucose Monitor / Dex Com 6 to try to connect thru [facility] so office can review his recent blood glucose readings..  Unable to reach patient. LM on VM with my contact number [phone].

## 2023-10-02 NOTE — Telephone Encounter (Signed)
 Second unsuccessful outreach to patient to check on diabetes / medication management and adherence. LM on VM with CB# [phone] or [phone]

## 2023-10-08 ENCOUNTER — Other Ambulatory Visit: Payer: Self-pay | Admitting: Family Medicine

## 2023-10-08 DIAGNOSIS — E1165 Type 2 diabetes mellitus with hyperglycemia: Secondary | ICD-10-CM

## 2023-10-09 ENCOUNTER — Telehealth: Payer: Self-pay | Admitting: Pharmacist

## 2023-10-09 MED ORDER — DEXCOM G6 TRANSMITTER MISC: 0 refills | Status: DC

## 2023-10-09 NOTE — Telephone Encounter (Signed)
 Copied from CRM [id number]. Topic: General - Other >> Oct 09, 2023  1:47 PM Robinson H wrote: Reason for CRM: Patient returning call to [facility] the Pharmacist, please reach back out.  Theo [phone]

## 2023-10-09 NOTE — Addendum Note (Signed)
 Addended by: CARLA MILLING B on: 10/09/2023 02:34 PM   Modules accepted: Orders

## 2023-10-09 NOTE — Telephone Encounter (Signed)
 Patient is returning your call.

## 2023-10-09 NOTE — Progress Notes (Addendum)
 Patient called back he accidentally thew away his transmitter for Dexcom. Usually the transmitter is used for 3 months but sensors are changed every 10 days.  Sent in new Rx for transmitter to [employer] [facility] but insurance might not pay for it this soon since was last filled 09/05/2023.

## 2023-10-09 NOTE — Telephone Encounter (Signed)
 Patient left a message that he has not received the transmitter for his DexCom G6 yet. It looks like Dr Levora sent the transmitter as requested to [facility] on 09/01/2023.  Called [employer]. They shipped a new transmitter on 09/05/2023 and it was delivered 09/08/2023 - Tracking number [id number]. Tried to call patient to discuss - LM on Vm and provided tracking number.  It looks like it was delivered and there is a picture available for him to review.  Cost was $90 and was processed thru his BCBS plan.

## 2023-10-16 ENCOUNTER — Telehealth: Payer: Self-pay | Admitting: Nurse Practitioner

## 2023-10-16 NOTE — Telephone Encounter (Signed)
Patient called to reschedule appointments.

## 2023-10-20 ENCOUNTER — Inpatient Hospital Stay: Payer: BC Managed Care – PPO | Admitting: Nurse Practitioner

## 2023-10-20 ENCOUNTER — Inpatient Hospital Stay: Payer: BC Managed Care – PPO

## 2023-10-22 ENCOUNTER — Other Ambulatory Visit: Payer: Self-pay | Admitting: Family Medicine

## 2023-10-22 DIAGNOSIS — E1165 Type 2 diabetes mellitus with hyperglycemia: Secondary | ICD-10-CM

## 2023-10-29 ENCOUNTER — Ambulatory Visit: Admitting: Family Medicine

## 2023-11-11 ENCOUNTER — Encounter: Payer: Self-pay | Admitting: Family Medicine

## 2023-11-11 ENCOUNTER — Ambulatory Visit: Admitting: Family Medicine

## 2023-11-11 VITALS — BP 126/80 | HR 82 | Temp 98.6°F | Resp 16 | Ht 65.0 in | Wt 152.4 lb

## 2023-11-11 DIAGNOSIS — E1165 Type 2 diabetes mellitus with hyperglycemia: Secondary | ICD-10-CM | POA: Diagnosis not present

## 2023-11-11 DIAGNOSIS — E785 Hyperlipidemia, unspecified: Secondary | ICD-10-CM | POA: Diagnosis not present

## 2023-11-11 DIAGNOSIS — Z7984 Long term (current) use of oral hypoglycemic drugs: Secondary | ICD-10-CM

## 2023-11-11 DIAGNOSIS — Z7985 Long-term (current) use of injectable non-insulin antidiabetic drugs: Secondary | ICD-10-CM

## 2023-11-11 DIAGNOSIS — I10 Essential (primary) hypertension: Secondary | ICD-10-CM | POA: Diagnosis not present

## 2023-11-11 LAB — COMPREHENSIVE METABOLIC PANEL WITH GFR
ALT: 26 U/L (ref 0–53)
AST: 26 U/L (ref 0–37)
Albumin: 4.7 g/dL (ref 3.5–5.2)
Alkaline Phosphatase: 83 U/L (ref 39–117)
BUN: 10 mg/dL (ref 6–23)
CO2: 30 meq/L (ref 19–32)
Calcium: 10 mg/dL (ref 8.4–10.5)
Chloride: 99 meq/L (ref 96–112)
Creatinine, Ser: 0.78 mg/dL (ref 0.40–1.50)
GFR: 104.29 mL/min (ref >60.00)
Glucose, Bld: 100 mg/dL — ABNORMAL HIGH (ref 70–99)
Potassium: 4.2 meq/L (ref 3.5–5.1)
Sodium: 136 meq/L (ref 135–145)
Total Bilirubin: 0.4 mg/dL (ref 0.2–1.2)
Total Protein: 8.8 g/dL — ABNORMAL HIGH (ref 6.0–8.3)

## 2023-11-11 LAB — LIPID PANEL
Cholesterol: 187 mg/dL (ref 0–200)
HDL: 50.7 mg/dL (ref >39.00)
LDL Cholesterol: 108 mg/dL — ABNORMAL HIGH (ref 0–99)
NonHDL: 136.72
Total CHOL/HDL Ratio: 4
Triglycerides: 145 mg/dL (ref 0.0–149.0)
VLDL: 29 mg/dL (ref 0.0–40.0)

## 2023-11-11 LAB — MICROALBUMIN / CREATININE URINE RATIO
Creatinine,U: 66 mg/dL
Microalb Creat Ratio: UNDETERMINED mg/g (ref 0.0–30.0)
Microalb, Ur: <0.7 mg/dL

## 2023-11-11 LAB — HEMOGLOBIN A1C: Hgb A1c MFr Bld: 7.7 % — ABNORMAL HIGH (ref 4.6–6.5)

## 2023-11-11 MED ORDER — ATORVASTATIN CALCIUM 20 MG PO TABS: 20.0000 mg | ORAL_TABLET | Freq: Every day | ORAL | 1 refills | Status: DC

## 2023-11-11 MED ORDER — AMLODIPINE BESYLATE 10 MG PO TABS: 10.0000 mg | ORAL_TABLET | Freq: Every day | ORAL | 1 refills | Status: DC

## 2023-11-11 NOTE — Patient Instructions (Signed)
 Thank you for coming in today.  We likely will decrease your insulin  to 12 units/day to lessen risk of low blood sugars, but let me see the labs first.  See information below to help prevent hypoglycemia or low blood sugars.  No other changes for now.  Follow-up in 3 months.  Take care!   Preventing Hypoglycemia Hypoglycemia is when the amount of sugar, or glucose, in your blood is too low. Low blood sugar can happen if you have diabetes or if you don't have diabetes. It may be an emergency. Work with your health care provider to make and change your meal plan as needed. This can help prevent low blood sugar. What can increase my risk? You may be more likely to get low blood sugar if: You take insulin  or other diabetes medicines. You skip or delay a meal or snack. You get sick. How can low blood sugar affect me? Mild symptoms Mild cases may not cause symptoms. If you do have symptoms, they may include: Hunger or feeling like you may vomit. Sweating and feeling cold to the touch. Feeling dizzy or light-headed. Being sleepy or having trouble sleeping. A fast heart rate. A headache. Blurry eyesight. Mood changes. These include feeling worried, nervous, or easily annoyed. Tingling or numbness around your mouth, lips, or tongue. If a mild case of low blood sugar isn't treated, it can become moderate or severe. Moderate symptoms If you have a moderate case, you may: Feel confused. Have changes in the way you act or move. Feel weak. Have an uneven heartbeat. Severe symptoms Having very low blood sugar is an emergency. It can cause: Fainting. Seizures. A coma. Death. What nutrition changes can I make? Work with your provider or an expert in healthy eating called a dietitian to make a meal plan. Eat meals at set times. Have snacks between meals, as told by your provider. Donot skip or delay meals or snacks. What other actions can I take to prevent low blood sugar?  Work closely with  your provider to manage your blood sugar. Make sure you know: What your blood sugar should be. How and when to check your blood sugar. The symptoms of low blood sugar. Be sure to eat food when you drink alcohol. When you're sick, check your blood sugar more often. Make a sick day plan in advance with your provider. Follow this plan when you can't eat or drink like normal. Always check your blood sugar before, during, and after exercise. How is this treated? Treating low blood sugar If you have low blood sugar, eat or drink something with sugar in it right away. The food or drink should have 15 grams of a fast-acting carbohydrate (carb). Options include: 4 oz (120 mL) of fruit juice. 4 oz (120 mL) of soda (not diet soda). A few pieces of hard candy. Check food labels to see how many pieces to eat. 1 Tbsp (15 mL) of sugar or honey. 4 glucose tablets. 1 tube of glucose gel. Treating low blood sugar if you have diabetes If you're alert and can swallow safely, follow the 15:15 rule: Take 15 grams of a fast-acting carb. Talk with your provider about how much carb you should take. Check your blood sugar 15 minutes after you take the carb. If your blood sugar is still at or below 70 mg/dL (3.9 mmol/L), take 15 grams of a carb again. If your blood sugar doesn't go above 70 mg/dL (3.9 mmol/L) after 3 tries, get help right away.  After your blood sugar goes back to normal, eat a meal or a snack within 1 hour. Treating very low blood sugar If your blood sugar is less than 54 mg/dL (3 mmol/L), it's an emergency. Get help right away. If you can't eat or drink, you will need to be given glucagon. A family member or friend should learn how to check your blood sugar and give you glucagon. Ask your provider if you should keep a glucagon kit at home. You may also need to be treated in a hospital. Where to find more information American Diabetes Association (ADA): diabetes.[employer] of Diabetes  and Digestive and Kidney Diseases (NIDDK): StageSync.si Association of Diabetes Care & Education Specialists: diabeteseducator.org Contact a health care provider if: You have diabetes and are having trouble keeping your blood sugar in the right range. You have low blood sugar often. Get help right away if: You can't get your blood sugar above 70 mg/dL (3.9 mmol/L) after 3 tries. Your blood sugar is below 54 mg/dL (3 mmol/L). You faint. You have a seizure. These symptoms may be an emergency. Call 911 right away. Do not wait to see if the symptoms will go away. Do not drive yourself to the hospital. This information is not intended to replace advice given to you by your health care provider. Make sure you discuss any questions you have with your health care provider. Document Revised: 05/29/2022 Document Reviewed: 05/29/2022 Elsevier Patient Education  2024 [employer].

## 2023-11-11 NOTE — Progress Notes (Signed)
 Subjective:  Patient ID: John Obrien, male    DOB: 07-Aug-1975  Age: 48 y.o. MRN: [id number]  CC:  Chief Complaint  Patient presents with   Medical Management of Chronic Issues    Pt is well no concerns, pt is fasting     HPI John Obrien presents for   Diabetes: Complicated by hyperglycemia, with increase of A1c to 13.0 in May.  Suspected progressive worsening of his diabetes until that time.  Had been started on Semglee  insulin  10 units initially then ultimately 20 units and his metformin  was changed to extended release (1000mg  QAM) to improve GI side effects.  As of his last visit June 4, readings were much better, with point-of-care 94 in the office, insulin  was decreased to 15 units/day with home monitoring of readings.  Home readings fasting: 98-130 Postprandial: 130-170 Symptomatic lows - 3 times since last visit in 50's, drank soda. Wears CGM.  No skipped meals.   He is on statin with Lipitor 20 mg daily, amlodipine  10 mg daily for hypertension. Microalbumin: Due, will recheck today. Optho, foot exam, pneumovax: Pneumonia vaccine: declines Shingles vaccine - declines.  Flu and COVID boosters recommended in the fall. Declined.   Lab Results  Component Value Date   HGBA1C 13.0 (H) 07/27/2023   HGBA1C 6.8 (H) 04/17/2023   HGBA1C 14.3 (H) 12/18/2022   Lab Results  Component Value Date   LDLCALC 95 04/17/2023   CREATININE 0.81 07/27/2023   BP Readings from Last 3 Encounters:  11/11/23 126/80  08/26/23 134/78  08/06/23 136/82  Home BP 128-130/80 range.    History Patient Active Problem List   Diagnosis Date Noted   Iron  deficiency anemia 02/07/2023   Hyperlipidemia 06/13/2022   Diet-controlled diabetes mellitus (HCC) 06/13/2022   Past Medical History:  Diagnosis Date   Anemia    Diabetes (HCC)    Hyperlipidemia    Hypertension    Iron  deficiency anemia    No past surgical history on file. No Known Allergies Prior to Admission medications    Medication Sig Start Date End Date Taking? Authorizing Provider  ACCU-CHEK GUIDE TEST test strip MAY SUBSTITUTE TO ANY MANUFACTURER COVERED BY PATIENT'S INSURANCE. 2 TIMES PER DAY. INSULIN -DEPENDENT 10/08/23  Yes [name], MD  amLODipine  (NORVASC ) 10 MG tablet Take 1 tablet (10 mg total) by mouth daily. 04/17/23  Yes [name], MD  atorvastatin  (LIPITOR) 20 MG tablet Take 1 tablet (20 mg total) by mouth daily. 04/17/23  Yes [name], MD  Blood Glucose Monitoring Suppl DEVI May substitute to any manufacturer covered by patient's insurance.  2 times per day.  Insulin -dependent 08/06/23  Yes [name], MD  Continuous Glucose Sensor (DEXCOM G6 SENSOR) MISC Apply to back of upper arm, change every 10 days. 08/26/23  Yes [name], MD  Continuous Glucose Transmitter (DEXCOM G6 TRANSMITTER) MISC Use as directed 10/09/23  Yes [name], MD  insulin  glargine-yfgn (SEMGLEE ) 100 UNIT/ML Pen Inject 20 Units into the skin daily. Total daily dose = 0.2-0.5 units/kg/day---> 50% Basal/50% Bolus 08/12/23  Yes [name], MD  Insulin  Pen Needle (NOVOFINE) 30G X 8 MM MISC Inject 10 each into the skin as needed. 07/27/23  Yes [name], MD  Iron , Ferrous Sulfate , 325 (65 Fe) MG TABS Take 325 mg by mouth daily. 01/02/23  Yes [name], MD  Lancet Device MISC May substitute to any manufacturer covered by patient's insurance.  2 times per day.  Insulin -dependent. 08/06/23  Yes [name], MD  Lancets Misc. MISC May substitute to any manufacturer covered by patient's insurance.  2 times per day, insulin -dependent 08/06/23  Yes [name], MD  metFORMIN  (GLUCOPHAGE -XR) 500 MG 24 hr tablet TAKE 2 TABLETS BY MOUTH EVERY DAY WITH BREAKFAST 10/22/23  Yes [name], MD   Social History   Socioeconomic History   Marital status: Single    Spouse name: Not on file   Number of children: 1   Years of education: 12th grade   Highest education  level: Never attended school  Occupational History   Occupation: [employer]  Tobacco Use   Smoking status: Never   Smokeless tobacco: Never  Vaping Use   Vaping status: Never Used  Substance and Sexual Activity   Alcohol use: Yes    Alcohol/week: 3.0 standard drinks of alcohol    Types: 3 Shots of liquor per week    Comment: 3 times per week   Drug use: Never   Sexual activity: Yes  Other Topics Concern   Not on file  Social History Narrative   Not on file   Social Drivers of Health   Financial Resource Strain: Low Risk  (11/10/2023)   Overall Financial Resource Strain (CARDIA)    Difficulty of Paying Living Expenses: Not hard at all  Food Insecurity: No Food Insecurity (11/10/2023)   Hunger Vital Sign    Worried About Running Out of Food in the Last Year: Never true    Ran Out of Food in the Last Year: Never true  Transportation Needs: No Transportation Needs (11/10/2023)   PRAPARE - Administrator, Civil Service (Medical): No    Lack of Transportation (Non-Medical): No  Physical Activity: Insufficiently Active (11/10/2023)   Exercise Vital Sign    Days of Exercise per Week: 2 days    Minutes of Exercise per Session: 20 min  Stress: No Stress Concern Present (11/10/2023)   [employer] of Occupational Health - Occupational Stress Questionnaire    Feeling of Stress: Not at all  Social Connections: Moderately Isolated (11/10/2023)   Social Connection and Isolation Panel    Frequency of Communication with Friends and Family: Three times a week    Frequency of Social Gatherings with Friends and Family: Once a week    Attends Religious Services: 1 to 4 times per year    Active Member of [employer] or Organizations: No    Attends Engineer, structural: Not on file    Marital Status: Never married  Intimate Partner Violence: Not At Risk (02/07/2023)   Humiliation, Afraid, Rape, and Kick questionnaire    Fear of Current or Ex-Partner: No    Emotionally Abused:  No    Physically Abused: No    Sexually Abused: No    Review of Systems  Constitutional:  Negative for fatigue and unexpected weight change.  Eyes:  Negative for visual disturbance.  Respiratory:  Negative for cough, chest tightness and shortness of breath.   Cardiovascular:  Negative for chest pain, palpitations and leg swelling.  Gastrointestinal:  Negative for abdominal pain and blood in stool.  Neurological:  Negative for dizziness, light-headedness and headaches.     Objective:   Vitals:   11/11/23 0855  BP: 126/80  Pulse: 82  Resp: 16  Temp: 98.6 F (37 C)  TempSrc: Temporal  SpO2: 100%  Weight: 152 lb 6.4 oz (69.1 kg)  Height: 5' 5 (1.651 m)     Physical Exam  Vitals reviewed.  Constitutional:      Appearance: He is well-developed.  HENT:     Head: Normocephalic and atraumatic.  Neck:     Vascular: No carotid bruit or JVD.  Cardiovascular:     Rate and Rhythm: Normal rate and regular rhythm.     Heart sounds: Normal heart sounds. No murmur heard. Pulmonary:     Effort: Pulmonary effort is normal.     Breath sounds: Normal breath sounds. No rales.  Musculoskeletal:     Right lower leg: No edema.     Left lower leg: No edema.  Skin:    General: Skin is warm and dry.  Neurological:     Mental Status: He is alert and oriented to person, place, and time.  Psychiatric:        Mood and Affect: Mood normal.        Assessment & Plan:  John Obrien is a 48 y.o. male . Type 2 diabetes mellitus with hyperglycemia, without long-term current use of insulin  (HCC) - Plan: Comprehensive metabolic panel with GFR, Hemoglobin A1c, Microalbumin / creatinine urine ratio  - Few isolated lows as above.  Overall well-controlled.  May need to decrease his basal insulin  but will check A1c, labs first.  Handout given on preventing hypoglycemia with RTC precautions.  Continue same dose metformin .  Discussed vaccines as above, declined.  87-month  follow-up  Hyperlipidemia, unspecified hyperlipidemia type - Plan: Comprehensive metabolic panel with GFR, Lipid panel, atorvastatin  (LIPITOR) 20 MG tablet  - Statin, check labs and adjust plan accordingly  Essential hypertension - Plan: Comprehensive metabolic panel with GFR, amLODipine  (NORVASC ) 10 MG tablet  - Stable, continue same dose amlodipine , check labs as above.  Meds ordered this encounter  Medications   amLODipine  (NORVASC ) 10 MG tablet    Sig: Take 1 tablet (10 mg total) by mouth daily.    Dispense:  90 tablet    Refill:  1   atorvastatin  (LIPITOR) 20 MG tablet    Sig: Take 1 tablet (20 mg total) by mouth daily.    Dispense:  90 tablet    Refill:  1   Patient Instructions  Thank you for coming in today.  We likely will decrease your insulin  to 12 units/day to lessen risk of low blood sugars, but let me see the labs first.  See information below to help prevent hypoglycemia or low blood sugars.  No other changes for now.  Follow-up in 3 months.  Take care!   Preventing Hypoglycemia Hypoglycemia is when the amount of sugar, or glucose, in your blood is too low. Low blood sugar can happen if you have diabetes or if you don't have diabetes. It may be an emergency. Work with your health care provider to make and change your meal plan as needed. This can help prevent low blood sugar. What can increase my risk? You may be more likely to get low blood sugar if: You take insulin  or other diabetes medicines. You skip or delay a meal or snack. You get sick. How can low blood sugar affect me? Mild symptoms Mild cases may not cause symptoms. If you do have symptoms, they may include: Hunger or feeling like you may vomit. Sweating and feeling cold to the touch. Feeling dizzy or light-headed. Being sleepy or having trouble sleeping. A fast heart rate. A headache. Blurry eyesight. Mood changes. These include feeling worried, nervous, or easily annoyed. Tingling or numbness  around your mouth, lips, or tongue. If a  mild case of low blood sugar isn't treated, it can become moderate or severe. Moderate symptoms If you have a moderate case, you may: Feel confused. Have changes in the way you act or move. Feel weak. Have an uneven heartbeat. Severe symptoms Having very low blood sugar is an emergency. It can cause: Fainting. Seizures. A coma. Death. What nutrition changes can I make? Work with your provider or an expert in healthy eating called a dietitian to make a meal plan. Eat meals at set times. Have snacks between meals, as told by your provider. Donot skip or delay meals or snacks. What other actions can I take to prevent low blood sugar?  Work closely with your provider to manage your blood sugar. Make sure you know: What your blood sugar should be. How and when to check your blood sugar. The symptoms of low blood sugar. Be sure to eat food when you drink alcohol. When you're sick, check your blood sugar more often. Make a sick day plan in advance with your provider. Follow this plan when you can't eat or drink like normal. Always check your blood sugar before, during, and after exercise. How is this treated? Treating low blood sugar If you have low blood sugar, eat or drink something with sugar in it right away. The food or drink should have 15 grams of a fast-acting carbohydrate (carb). Options include: 4 oz (120 mL) of fruit juice. 4 oz (120 mL) of soda (not diet soda). A few pieces of hard candy. Check food labels to see how many pieces to eat. 1 Tbsp (15 mL) of sugar or honey. 4 glucose tablets. 1 tube of glucose gel. Treating low blood sugar if you have diabetes If you're alert and can swallow safely, follow the 15:15 rule: Take 15 grams of a fast-acting carb. Talk with your provider about how much carb you should take. Check your blood sugar 15 minutes after you take the carb. If your blood sugar is still at or below 70 mg/dL (3.9  mmol/L), take 15 grams of a carb again. If your blood sugar doesn't go above 70 mg/dL (3.9 mmol/L) after 3 tries, get help right away. After your blood sugar goes back to normal, eat a meal or a snack within 1 hour. Treating very low blood sugar If your blood sugar is less than 54 mg/dL (3 mmol/L), it's an emergency. Get help right away. If you can't eat or drink, you will need to be given glucagon. A family member or friend should learn how to check your blood sugar and give you glucagon. Ask your provider if you should keep a glucagon kit at home. You may also need to be treated in a hospital. Where to find more information American Diabetes Association (ADA): diabetes.[employer] of Diabetes and Digestive and Kidney Diseases (NIDDK): StageSync.si Association of Diabetes Care & Education Specialists: diabeteseducator.org Contact a health care provider if: You have diabetes and are having trouble keeping your blood sugar in the right range. You have low blood sugar often. Get help right away if: You can't get your blood sugar above 70 mg/dL (3.9 mmol/L) after 3 tries. Your blood sugar is below 54 mg/dL (3 mmol/L). You faint. You have a seizure. These symptoms may be an emergency. Call 911 right away. Do not wait to see if the symptoms will go away. Do not drive yourself to the hospital. This information is not intended to replace advice given to you by your health care provider. Make  sure you discuss any questions you have with your health care provider. Document Revised: 05/29/2022 Document Reviewed: 05/29/2022 Elsevier Patient Education  2024 Elsevier Inc.    Signed,   Reyes Pines, MD [facility], [facility] 11/11/23 9:14 AM

## 2023-11-17 ENCOUNTER — Ambulatory Visit: Payer: Self-pay | Admitting: Family Medicine

## 2023-12-23 ENCOUNTER — Other Ambulatory Visit: Payer: Self-pay | Admitting: Family Medicine

## 2024-01-13 ENCOUNTER — Telehealth: Payer: Self-pay

## 2024-01-13 DIAGNOSIS — E1165 Type 2 diabetes mellitus with hyperglycemia: Secondary | ICD-10-CM

## 2024-01-13 MED ORDER — DEXCOM G6 TRANSMITTER MISC: 0 refills | Status: AC

## 2024-01-13 MED ORDER — DEXCOM G6 SENSOR MISC
3 refills | Status: DC
Start: 2024-01-13 — End: 2024-01-27

## 2024-01-13 NOTE — Telephone Encounter (Signed)
 Patient is wanting to change back to the [place] 7, he is currently on the [place] 6 and states the 7 seemed to work better for him. Patient last in office visit was 8/20. Is this okay to change?

## 2024-01-13 NOTE — Telephone Encounter (Signed)
 I believe that he is referring to Dexcom, not Libre, but I am fine with him being changed back to different device, what ever is covered by insurance  - okay to make that change.  Thanks

## 2024-01-13 NOTE — Telephone Encounter (Signed)
 Sent in the Dexcom 7 sensor and receiver, pt called and informed

## 2024-01-13 NOTE — Telephone Encounter (Signed)
 Copied from CRM [id number]. Topic: Clinical - Medical Advice >> Jan 13, 2024 12:20 PM [place] wrote: Reason for CRM: Patient wanting to go back to [place] 7, currently on 6, but he likes the 7 better

## 2024-01-14 ENCOUNTER — Other Ambulatory Visit: Payer: Self-pay | Admitting: Nurse Practitioner

## 2024-01-14 DIAGNOSIS — D500 Iron deficiency anemia secondary to blood loss (chronic): Secondary | ICD-10-CM

## 2024-01-14 NOTE — Progress Notes (Deleted)
 Patient Care Team: John Reyes SAUNDERS, MD as PCP - [facility] [employer] [facility] as PCP - Ophthalmology Carla Milling, RPH-CPP (Pharmacist)  Clinic Day:  01/14/2024  Referring physician: [name], MD  ASSESSMENT & PLAN:   Assessment & Plan: Iron  deficiency anemia Sep 2024- Hmg 9, Ferritin 2, Iron  sat 4%. Started oral iron  per pcp. Today, Hemoglobin 10.6. Ferritin 20. Improved. Poor tolerance to oral iron . Continues to be symptomatic of anemia. Likely due to iron  deficiency - from etiology GI blood loss/malabsorption.  Poor tolerance on oral iron .  Discussed regarding IV iron  infusion/Venofer . Discussed the potential acute infusion reactions with IV iron ; which are quite rare.  Patient understands the risk; will proceed with infusions. Recommend venofer  x 5, weekly.   -Final scheduled dose IV Venofer  on 04/17/2023.  Labs improved today.  Waiting for ferritin to result.  Additional IV iron  treatments to be added as indicated. -Colonoscopy scheduled for 05/26/2023. -Continue oral iron  every day. -Continue to check blood count with iron  panel every 6 weeks and treat with IV iron  as indicated. -Labs with office visit in 5 months.    The patient understands the plans discussed today and is in agreement with them.  He knows to contact our office if he develops concerns prior to his next appointment.  I provided *** minutes of face-to-face time during this encounter and > 50% was spent counseling as documented under my assessment and plan.    [name], NP  [facility] - A DEPT OF [facility] [address] [place] [address] Dept: [phone] Dept Fax: [phone]   No orders of the defined types were placed in this encounter.     CHIEF COMPLAINT:  CC: Iron  deficiency anemia  Current Treatment: IV Venofer  as needed  INTERVAL HISTORY:  John Obrien is here today for repeat clinical  assessment.  He last saw me on 04/21/2023.  At that time, blood count had recovered to Hgb of 12.3 and HCT 39.  He denies fevers or chills. He denies pain. His appetite is good. His weight {Weight change:10426}.  I have reviewed the past medical history, past surgical history, social history and family history with the patient and they are unchanged from previous note.  ALLERGIES:  has no known allergies.  MEDICATIONS:  Current Outpatient Medications  Medication Sig Dispense Refill   ACCU-CHEK GUIDE TEST test strip MAY SUBSTITUTE TO ANY MANUFACTURER COVERED BY PATIENT'S INSURANCE. 2 TIMES PER DAY. INSULIN -DEPENDENT 100 strip 0   amLODipine  (NORVASC ) 10 MG tablet Take 1 tablet (10 mg total) by mouth daily. 90 tablet 1   atorvastatin  (LIPITOR) 20 MG tablet Take 1 tablet (20 mg total) by mouth daily. 90 tablet 1   Blood Glucose Monitoring Suppl DEVI May substitute to any manufacturer covered by patient's insurance.  2 times per day.  Insulin -dependent 1 each 0   Continuous Glucose Sensor (DEXCOM G6 SENSOR) MISC Apply to back of upper arm, change every 10 days. 3 each 3   Continuous Glucose Transmitter (DEXCOM G6 TRANSMITTER) MISC Apply 1 sensor every 14 days and monitor blood glucose using receiver device 1 each 0   insulin  glargine-yfgn (SEMGLEE ) 100 UNIT/ML Pen Inject 20 Units into the skin daily. Total daily dose = 0.2-0.5 units/kg/day---> 50% Basal/50% Bolus 15 mL 1   Insulin  Pen Needle (NOVOFINE) 30G X 8 MM MISC Inject 10 each into the skin as needed. 90 each 1  Iron , Ferrous Sulfate , 325 (65 Fe) MG TABS Take 325 mg by mouth daily. 30 tablet 5   Lancet Device MISC May substitute to any manufacturer covered by patient's insurance.  2 times per day.  Insulin -dependent. 1 each 0   Lancets Misc. MISC May substitute to any manufacturer covered by patient's insurance.  2 times per day, insulin -dependent 100 each 0   metFORMIN  (GLUCOPHAGE -XR) 500 MG 24 hr tablet TAKE 2 TABLETS BY MOUTH EVERY DAY WITH  BREAKFAST 180 tablet 1   No current facility-administered medications for this visit.    HISTORY OF PRESENT ILLNESS:   Oncology History   No history exists.      REVIEW OF SYSTEMS:   Constitutional: Denies fevers, chills or abnormal weight loss Eyes: Denies blurriness of vision Ears, nose, mouth, throat, and face: Denies mucositis or sore throat Respiratory: Denies cough, dyspnea or wheezes Cardiovascular: Denies palpitation, chest discomfort or lower extremity swelling Gastrointestinal:  Denies nausea, heartburn or change in bowel habits Skin: Denies abnormal skin rashes Lymphatics: Denies new lymphadenopathy or easy bruising Neurological:Denies numbness, tingling or new weaknesses Behavioral/Psych: Mood is stable, no new changes  All other systems were reviewed with the patient and are negative.   VITALS:  There were no vitals taken for this visit.  Wt Readings from Last 3 Encounters:  11/11/23 152 lb 6.4 oz (69.1 kg)  08/26/23 154 lb (69.9 kg)  08/06/23 151 lb 3.2 oz (68.6 kg)    There is no height or weight on file to calculate BMI.  Performance status (ECOG): {CHL ONC [id number]  PHYSICAL EXAM:   GENERAL:alert, no distress and comfortable SKIN: skin color, texture, turgor are normal, no rashes or significant lesions EYES: normal, Conjunctiva are pink and non-injected, sclera clear OROPHARYNX:no exudate, no erythema and lips, buccal mucosa, and tongue normal  NECK: supple, thyroid  normal size, non-tender, without nodularity LYMPH:  no palpable lymphadenopathy in the cervical, axillary or inguinal LUNGS: clear to auscultation and percussion with normal breathing effort HEART: regular rate & rhythm and no murmurs and no lower extremity edema ABDOMEN:abdomen soft, non-tender and normal bowel sounds Musculoskeletal:no cyanosis of digits and no clubbing  NEURO: alert & oriented x 3 with fluent speech, no focal motor/sensory deficits  LABORATORY DATA:  I have  reviewed the data as listed    Component Value Date/Time   NA 136 11/11/2023 0932   NA 137 02/09/2020 1041   K 4.2 11/11/2023 0932   CL 99 11/11/2023 0932   CO2 30 11/11/2023 0932   GLUCOSE 100 (H) 11/11/2023 0932   BUN 10 11/11/2023 0932   BUN 14 02/09/2020 1041   CREATININE 0.78 11/11/2023 0932   CREATININE 0.90 04/21/2023 1306   CALCIUM  10.0 11/11/2023 0932   PROT 8.8 (H) 11/11/2023 0932   PROT 8.3 02/09/2020 1041   ALBUMIN 4.7 11/11/2023 0932   ALBUMIN 4.7 02/09/2020 1041   AST 26 11/11/2023 0932   AST 26 04/21/2023 1306   ALT 26 11/11/2023 0932   ALT 26 04/21/2023 1306   ALKPHOS 83 11/11/2023 0932   BILITOT 0.4 11/11/2023 0932   BILITOT 0.3 04/21/2023 1306   GFRNONAA >60 04/21/2023 1306   GFRAA 124 02/09/2020 1041    No results found for: SPEP, UPEP  Lab Results  Component Value Date   WBC 6.9 04/21/2023   NEUTROABS 3.6 04/21/2023   HGB 12.3 (L) 04/21/2023   HCT 39.0 04/21/2023   MCV 82.5 04/21/2023   PLT 499 (H) 04/21/2023      Chemistry  Component Value Date/Time   NA 136 11/11/2023 0932   NA 137 02/09/2020 1041   K 4.2 11/11/2023 0932   CL 99 11/11/2023 0932   CO2 30 11/11/2023 0932   BUN 10 11/11/2023 0932   BUN 14 02/09/2020 1041   CREATININE 0.78 11/11/2023 0932   CREATININE 0.90 04/21/2023 1306      Component Value Date/Time   CALCIUM  10.0 11/11/2023 0932   ALKPHOS 83 11/11/2023 0932   AST 26 11/11/2023 0932   AST 26 04/21/2023 1306   ALT 26 11/11/2023 0932   ALT 26 04/21/2023 1306   BILITOT 0.4 11/11/2023 0932   BILITOT 0.3 04/21/2023 1306       RADIOGRAPHIC STUDIES: I have personally reviewed the radiological images as listed and agreed with the findings in the report. No results found.

## 2024-01-14 NOTE — Assessment & Plan Note (Deleted)
 Sep 2024- Hmg 9, Ferritin 2, Iron sat 4%. Started oral iron per pcp. Today, Hemoglobin 10.6. Ferritin 20. Improved. Poor tolerance to oral iron. Continues to be symptomatic of anemia. Likely due to iron deficiency - from etiology GI blood loss/malabsorption.  Poor tolerance on oral iron.  Discussed regarding IV iron infusion/Venofer. Discussed the potential acute infusion reactions with IV iron; which are quite rare.  Patient understands the risk; will proceed with infusions. Recommend venofer x 5, weekly.   -Final scheduled dose IV Venofer on 04/17/2023.  Labs improved today.  Waiting for ferritin to result.  Additional IV iron treatments to be added as indicated. -Colonoscopy scheduled for 05/26/2023. -Continue oral iron every day. -Continue to check blood count with iron panel every 6 weeks and treat with IV iron as indicated. -Labs with office visit in 5 months.

## 2024-01-15 ENCOUNTER — Inpatient Hospital Stay

## 2024-01-15 ENCOUNTER — Inpatient Hospital Stay: Admitting: Nurse Practitioner

## 2024-01-27 MED ORDER — DEXCOM G7 SENSOR MISC: 3 refills | Status: AC

## 2024-01-27 NOTE — Addendum Note (Signed)
 Addended by: Shanai Lartigue R on: 01/27/2024 05:44 PM   Modules accepted: Orders

## 2024-01-27 NOTE — Telephone Encounter (Signed)
 Hey Dr. Levora, do you mind ordering the Dexcom G7 for the patient? He Received the G6 instead.    Copied from CRM #[id number]. Topic: Clinical - Prescription Issue >> Jan 27, 2024 12:35 PM John Obrien ORN wrote: Reason for CRM: pt calling to f/u on request for [facility] G7 as discussed was still prescribed G6. Please contact pt to advise

## 2024-01-27 NOTE — Telephone Encounter (Signed)
 ordered

## 2024-02-12 ENCOUNTER — Ambulatory Visit: Admitting: Family Medicine

## 2024-02-22 NOTE — Progress Notes (Deleted)
 Patient Care Team: [name], MD as PCP - [facility] [employer] [facility] as PCP - Ophthalmology Carla Milling, RPH-CPP (Pharmacist)  Clinic Day:  02/22/2024  Referring physician: [name], MD  ASSESSMENT & PLAN:   Assessment & Plan: Iron  deficiency anemia Sep 2024- Hmg 9, Ferritin 2, Iron  sat 4%. Started oral iron  per pcp. Today, Hemoglobin 10.6. Ferritin 20. Improved. Poor tolerance to oral iron . Continues to be symptomatic of anemia. Likely due to iron  deficiency - from etiology GI blood loss/malabsorption.  Poor tolerance on oral iron .  Discussed regarding IV iron  infusion/Venofer . Discussed the potential acute infusion reactions with IV iron ; which are quite rare.  Patient understands the risk; will proceed with infusions. Recommend venofer  x 5, weekly.   -Final scheduled dose IV Venofer  on 04/17/2023.  Labs improved today.  Waiting for ferritin to result.  Additional IV iron  treatments to be added as indicated. -Colonoscopy scheduled for 05/26/2023. -Continue oral iron  every day. -Continue to check blood count with iron  panel every 6 weeks and treat with IV iron  as indicated. -Labs with office visit in 5 months.    The patient understands the plans discussed today and is in agreement with them.  He knows to contact our office if he develops concerns prior to his next appointment.  I provided *** minutes of face-to-face time during this encounter and > 50% was spent counseling as documented under my assessment and plan.    John FORBES Lessen, NP  [facility] - A DEPT OF [facility] [address] [place] [address] Dept: [phone] Dept Fax: [phone]   No orders of the defined types were placed in this encounter.     CHIEF COMPLAINT:  CC: Iron  deficiency anemia due to chronic blood loss  Current Treatment: IV Venofer  when needed  INTERVAL HISTORY:  John Obrien is here  today for repeat clinical assessment.  He last saw me on 04/21/2023.  He denies fevers or chills. He denies pain. His appetite is good. His weight {Weight change:10426}.  I have reviewed the past medical history, past surgical history, social history and family history with the patient and they are unchanged from previous note.  ALLERGIES:  has no known allergies.  MEDICATIONS:  Current Outpatient Medications  Medication Sig Dispense Refill   ACCU-CHEK GUIDE TEST test strip MAY SUBSTITUTE TO ANY MANUFACTURER COVERED BY PATIENT'S INSURANCE. 2 TIMES PER DAY. INSULIN -DEPENDENT 100 strip 0   amLODipine  (NORVASC ) 10 MG tablet Take 1 tablet (10 mg total) by mouth daily. 90 tablet 1   atorvastatin  (LIPITOR) 20 MG tablet Take 1 tablet (20 mg total) by mouth daily. 90 tablet 1   Blood Glucose Monitoring Suppl DEVI May substitute to any manufacturer covered by patient's insurance.  2 times per day.  Insulin -dependent 1 each 0   Continuous Glucose Sensor (DEXCOM G7 SENSOR) MISC Apply and replace every 10 to 15 days, depending on device dispensed. 3 each 3   Continuous Glucose Transmitter (DEXCOM G6 TRANSMITTER) MISC Apply 1 sensor every 14 days and monitor blood glucose using receiver device 1 each 0   insulin  glargine-yfgn (SEMGLEE ) 100 UNIT/ML Pen Inject 20 Units into the skin daily. Total daily dose = 0.2-0.5 units/kg/day---> 50% Basal/50% Bolus 15 mL 1   Insulin  Pen Needle (NOVOFINE) 30G X 8 MM MISC Inject 10 each into the skin as needed. 90 each 1   Iron , Ferrous Sulfate , 325 (65 Fe) MG TABS  Take 325 mg by mouth daily. 30 tablet 5   Lancet Device MISC May substitute to any manufacturer covered by patient's insurance.  2 times per day.  Insulin -dependent. 1 each 0   Lancets Misc. MISC May substitute to any manufacturer covered by patient's insurance.  2 times per day, insulin -dependent 100 each 0   metFORMIN  (GLUCOPHAGE -XR) 500 MG 24 hr tablet TAKE 2 TABLETS BY MOUTH EVERY DAY WITH BREAKFAST 180 tablet 1    No current facility-administered medications for this visit.    HISTORY OF PRESENT ILLNESS:   Oncology History   No history exists.      REVIEW OF SYSTEMS:   Constitutional: Denies fevers, chills or abnormal weight loss Eyes: Denies blurriness of vision Ears, nose, mouth, throat, and face: Denies mucositis or sore throat Respiratory: Denies cough, dyspnea or wheezes Cardiovascular: Denies palpitation, chest discomfort or lower extremity swelling Gastrointestinal:  Denies nausea, heartburn or change in bowel habits Skin: Denies abnormal skin rashes Lymphatics: Denies new lymphadenopathy or easy bruising Neurological:Denies numbness, tingling or new weaknesses Behavioral/Psych: Mood is stable, no new changes  All other systems were reviewed with the patient and are negative.   VITALS:  There were no vitals taken for this visit.  Wt Readings from Last 3 Encounters:  11/11/23 152 lb 6.4 oz (69.1 kg)  08/26/23 154 lb (69.9 kg)  08/06/23 151 lb 3.2 oz (68.6 kg)    There is no height or weight on file to calculate BMI.  Performance status (ECOG): {CHL ONC [id number]  PHYSICAL EXAM:   GENERAL:alert, no distress and comfortable SKIN: skin color, texture, turgor are normal, no rashes or significant lesions EYES: normal, Conjunctiva are pink and non-injected, sclera clear OROPHARYNX:no exudate, no erythema and lips, buccal mucosa, and tongue normal  NECK: supple, thyroid  normal size, non-tender, without nodularity LYMPH:  no palpable lymphadenopathy in the cervical, axillary or inguinal LUNGS: clear to auscultation and percussion with normal breathing effort HEART: regular rate & rhythm and no murmurs and no lower extremity edema ABDOMEN:abdomen soft, non-tender and normal bowel sounds Musculoskeletal:no cyanosis of digits and no clubbing  NEURO: alert & oriented x 3 with fluent speech, no focal motor/sensory deficits  LABORATORY DATA:  I have reviewed the data as  listed    Component Value Date/Time   NA 136 11/11/2023 0932   NA 137 02/09/2020 1041   K 4.2 11/11/2023 0932   CL 99 11/11/2023 0932   CO2 30 11/11/2023 0932   GLUCOSE 100 (H) 11/11/2023 0932   BUN 10 11/11/2023 0932   BUN 14 02/09/2020 1041   CREATININE 0.78 11/11/2023 0932   CREATININE 0.90 04/21/2023 1306   CALCIUM  10.0 11/11/2023 0932   PROT 8.8 (H) 11/11/2023 0932   PROT 8.3 02/09/2020 1041   ALBUMIN 4.7 11/11/2023 0932   ALBUMIN 4.7 02/09/2020 1041   AST 26 11/11/2023 0932   AST 26 04/21/2023 1306   ALT 26 11/11/2023 0932   ALT 26 04/21/2023 1306   ALKPHOS 83 11/11/2023 0932   BILITOT 0.4 11/11/2023 0932   BILITOT 0.3 04/21/2023 1306   GFRNONAA >60 04/21/2023 1306   GFRAA 124 02/09/2020 1041    No results found for: SPEP, UPEP  Lab Results  Component Value Date   WBC 6.9 04/21/2023   NEUTROABS 3.6 04/21/2023   HGB 12.3 (L) 04/21/2023   HCT 39.0 04/21/2023   MCV 82.5 04/21/2023   PLT 499 (H) 04/21/2023      Chemistry      Component Value Date/Time  NA 136 11/11/2023 0932   NA 137 02/09/2020 1041   K 4.2 11/11/2023 0932   CL 99 11/11/2023 0932   CO2 30 11/11/2023 0932   BUN 10 11/11/2023 0932   BUN 14 02/09/2020 1041   CREATININE 0.78 11/11/2023 0932   CREATININE 0.90 04/21/2023 1306      Component Value Date/Time   CALCIUM  10.0 11/11/2023 0932   ALKPHOS 83 11/11/2023 0932   AST 26 11/11/2023 0932   AST 26 04/21/2023 1306   ALT 26 11/11/2023 0932   ALT 26 04/21/2023 1306   BILITOT 0.4 11/11/2023 0932   BILITOT 0.3 04/21/2023 1306       RADIOGRAPHIC STUDIES: I have personally reviewed the radiological images as listed and agreed with the findings in the report. No results found.

## 2024-02-22 NOTE — Assessment & Plan Note (Deleted)
 Sep 2024- Hmg 9, Ferritin 2, Iron sat 4%. Started oral iron per pcp. Today, Hemoglobin 10.6. Ferritin 20. Improved. Poor tolerance to oral iron. Continues to be symptomatic of anemia. Likely due to iron deficiency - from etiology GI blood loss/malabsorption.  Poor tolerance on oral iron.  Discussed regarding IV iron infusion/Venofer. Discussed the potential acute infusion reactions with IV iron; which are quite rare.  Patient understands the risk; will proceed with infusions. Recommend venofer x 5, weekly.   -Final scheduled dose IV Venofer on 04/17/2023.  Labs improved today.  Waiting for ferritin to result.  Additional IV iron treatments to be added as indicated. -Colonoscopy scheduled for 05/26/2023. -Continue oral iron every day. -Continue to check blood count with iron panel every 6 weeks and treat with IV iron as indicated. -Labs with office visit in 5 months.

## 2024-02-23 ENCOUNTER — Inpatient Hospital Stay: Attending: Family Medicine

## 2024-02-23 ENCOUNTER — Inpatient Hospital Stay: Admitting: Nurse Practitioner

## 2024-02-23 DIAGNOSIS — D500 Iron deficiency anemia secondary to blood loss (chronic): Secondary | ICD-10-CM

## 2024-03-02 ENCOUNTER — Ambulatory Visit: Admitting: Family Medicine

## 2024-04-13 ENCOUNTER — Ambulatory Visit (INDEPENDENT_AMBULATORY_CARE_PROVIDER_SITE_OTHER): Admitting: Family Medicine

## 2024-04-13 ENCOUNTER — Encounter: Payer: Self-pay | Admitting: Family Medicine

## 2024-04-13 VITALS — BP 134/82 | HR 89 | Temp 98.3°F | Resp 18 | Ht 65.0 in | Wt 157.8 lb

## 2024-04-13 DIAGNOSIS — Z794 Long term (current) use of insulin: Secondary | ICD-10-CM

## 2024-04-13 DIAGNOSIS — E1165 Type 2 diabetes mellitus with hyperglycemia: Secondary | ICD-10-CM

## 2024-04-13 DIAGNOSIS — D509 Iron deficiency anemia, unspecified: Secondary | ICD-10-CM

## 2024-04-13 DIAGNOSIS — I10 Essential (primary) hypertension: Secondary | ICD-10-CM

## 2024-04-13 DIAGNOSIS — E785 Hyperlipidemia, unspecified: Secondary | ICD-10-CM | POA: Diagnosis not present

## 2024-04-13 LAB — LIPID PANEL
Cholesterol: 196 mg/dL (ref 28–200)
HDL: 48.3 mg/dL
LDL Cholesterol: 112 mg/dL — ABNORMAL HIGH (ref 10–99)
NonHDL: 148.12
Total CHOL/HDL Ratio: 4
Triglycerides: 182 mg/dL — ABNORMAL HIGH (ref 10.0–149.0)
VLDL: 36.4 mg/dL (ref 0.0–40.0)

## 2024-04-13 LAB — COMPREHENSIVE METABOLIC PANEL WITH GFR
ALT: 18 U/L (ref 3–53)
AST: 18 U/L (ref 5–37)
Albumin: 4.6 g/dL (ref 3.5–5.2)
Alkaline Phosphatase: 101 U/L (ref 39–117)
BUN: 8 mg/dL (ref 6–23)
CO2: 30 meq/L (ref 19–32)
Calcium: 10.1 mg/dL (ref 8.4–10.5)
Chloride: 99 meq/L (ref 96–112)
Creatinine, Ser: 0.75 mg/dL (ref 0.40–1.50)
GFR: 105.22 mL/min
Glucose, Bld: 181 mg/dL — ABNORMAL HIGH (ref 70–99)
Potassium: 4.7 meq/L (ref 3.5–5.1)
Sodium: 137 meq/L (ref 135–145)
Total Bilirubin: 0.3 mg/dL (ref 0.2–1.2)
Total Protein: 8.5 g/dL — ABNORMAL HIGH (ref 6.0–8.3)

## 2024-04-13 LAB — HEMOGLOBIN A1C: Hgb A1c MFr Bld: 8.3 % — ABNORMAL HIGH (ref 4.6–6.5)

## 2024-04-13 LAB — CBC
HCT: 34.2 % — ABNORMAL LOW (ref 39.0–52.0)
Hemoglobin: 10.7 g/dL — ABNORMAL LOW (ref 13.0–17.0)
MCHC: 31.4 g/dL (ref 30.0–36.0)
MCV: 75.1 fl — ABNORMAL LOW (ref 78.0–100.0)
Platelets: 735 K/uL — ABNORMAL HIGH (ref 150.0–400.0)
RBC: 4.55 Mil/uL (ref 4.22–5.81)
RDW: 14.9 % (ref 11.5–15.5)
WBC: 8.8 K/uL (ref 4.0–10.5)

## 2024-04-13 MED ORDER — AMLODIPINE BESYLATE 10 MG PO TABS: 10.0000 mg | ORAL_TABLET | Freq: Every day | ORAL | 1 refills | Status: AC

## 2024-04-13 MED ORDER — INSULIN GLARGINE-YFGN 100 UNIT/ML ~~LOC~~ SOPN: 20.0000 [IU] | PEN_INJECTOR | Freq: Every day | SUBCUTANEOUS | 1 refills | Status: AC

## 2024-04-13 MED ORDER — ATORVASTATIN CALCIUM 20 MG PO TABS: 20.0000 mg | ORAL_TABLET | Freq: Every day | ORAL | 1 refills | Status: AC

## 2024-04-13 MED ORDER — METFORMIN HCL ER 500 MG PO TB24: 1000.0000 mg | ORAL_TABLET | Freq: Every day | ORAL | 1 refills | Status: AC

## 2024-04-13 NOTE — Progress Notes (Signed)
 "  Subjective:  Patient ID: John Obrien, male    DOB: 1976/02/16  Age: 49 y.o. MRN: [id number]  CC:  Chief Complaint  Patient presents with   Follow-up    3 month follow up. Doing well. No concerns    HPI Chukwudi E Lafortune presents for   Diabetes: Complicated by hyperglycemia with prior increase of A1c up to 13.0 last May.  Started on insulin , ultimately 20 units, metformin  changed to extended release 1000 mg every morning for GI side effects.  He did have improvement in his readings and insulin  was decreased to 15 units/day.  Improved control with A1c of 7.7 last August. Still on 15u per day.  He is on statin with Lipitor 20 mg daily. Total protein slightly high on previous labs. Denies any side effects with current medications. Home readings fasting - 120 range. No postprandials.  No symptomatic lows.  Microalbumin: Normal ratio 11/11/2023 Optho, foot exam, pneumovax:  Declines recommended vaccines.   Foot exam: Diabetic Foot Exam - Simple   Simple Foot Form Diabetic Foot exam was performed with the following findings: Yes 04/13/2024  8:55 AM  Visual Inspection No deformities, no ulcerations, no other skin breakdown bilaterally: Yes Sensation Testing Intact to touch and monofilament testing bilaterally: Yes Pulse Check Posterior Tibialis and Dorsalis pulse intact bilaterally: Yes Comments      Lab Results  Component Value Date   HGBA1C 7.7 (H) 11/11/2023   HGBA1C 13.0 (H) 07/27/2023   HGBA1C 6.8 (H) 04/17/2023   Lab Results  Component Value Date   MICROALBUR <0.7 11/11/2023   LDLCALC 108 (H) 11/11/2023   CREATININE 0.78 11/11/2023   Hypertension: Amlodipine  10 mg daily without any side effects Home readings: 128/76 BP Readings from Last 3 Encounters:  04/13/24 134/82  11/11/23 126/80  08/26/23 134/78   Lab Results  Component Value Date   CREATININE 0.78 11/11/2023   Hyperlipidemia: Lipitor 20 mg daily, no new myalgias or side effects. Lab Results   Component Value Date   CHOL 187 11/11/2023   HDL 50.70 11/11/2023   LDLCALC 108 (H) 11/11/2023   LDLDIRECT 74.0 06/13/2022   TRIG 145.0 11/11/2023   CHOLHDL 4 11/11/2023   Lab Results  Component Value Date   ALT 26 11/11/2023   AST 26 11/11/2023   ALKPHOS 83 11/11/2023   BILITOT 0.4 11/11/2023   Iron  deficiency anemia Followed by hematology, oncology, treated with IV iron  as indicated.  Most recent blood work in January of last year.  No recent visit with hematology/oncology. Plan for follow up after bloodwork. No oral iron , infusion last year.   Lab Results  Component Value Date   IRON  113 04/21/2023   TIBC 434 04/21/2023   FERRITIN 369 (H) 04/21/2023   Lab Results  Component Value Date   WBC 6.9 04/21/2023   HGB 12.3 (L) 04/21/2023   HCT 39.0 04/21/2023   MCV 82.5 04/21/2023   PLT 499 (H) 04/21/2023     History Patient Active Problem List   Diagnosis Date Noted   Iron  deficiency anemia 02/07/2023   Hyperlipidemia 06/13/2022   Diet-controlled diabetes mellitus (HCC) 06/13/2022   Past Medical History:  Diagnosis Date   Anemia    Diabetes (HCC)    Hyperlipidemia    Hypertension    Iron  deficiency anemia    No past surgical history on file. Allergies[1] Prior to Admission medications  Medication Sig Start Date End Date Taking? Authorizing Provider  ACCU-CHEK GUIDE TEST test strip MAY SUBSTITUTE TO ANY  MANUFACTURER COVERED BY PATIENT'S INSURANCE. 2 TIMES PER DAY. INSULIN -DEPENDENT 10/08/23  Yes [name], MD  amLODipine  (NORVASC ) 10 MG tablet Take 1 tablet (10 mg total) by mouth daily. 11/11/23  Yes [name], MD  atorvastatin  (LIPITOR) 20 MG tablet Take 1 tablet (20 mg total) by mouth daily. 11/11/23  Yes [name], MD  Blood Glucose Monitoring Suppl DEVI May substitute to any manufacturer covered by patient's insurance.  2 times per day.  Insulin -dependent 08/06/23  Yes [name], MD  Continuous Glucose Sensor (DEXCOM G7 SENSOR)  MISC Apply and replace every 10 to 15 days, depending on device dispensed. 01/27/24  Yes [name], MD  Continuous Glucose Transmitter (DEXCOM G6 TRANSMITTER) MISC Apply 1 sensor every 14 days and monitor blood glucose using receiver device 01/13/24  Yes [name], MD  insulin  glargine-yfgn (SEMGLEE ) 100 UNIT/ML Pen Inject 20 Units into the skin daily. Total daily dose = 0.2-0.5 units/kg/day---> 50% Basal/50% Bolus 08/12/23  Yes [name], MD  Insulin  Pen Needle (NOVOFINE) 30G X 8 MM MISC Inject 10 each into the skin as needed. 07/27/23  Yes [name], MD  Iron , Ferrous Sulfate , 325 (65 Fe) MG TABS Take 325 mg by mouth daily. 01/02/23  Yes [name], MD  Lancet Device MISC May substitute to any manufacturer covered by patient's insurance.  2 times per day.  Insulin -dependent. 08/06/23  Yes [name], MD  Lancets Misc. MISC May substitute to any manufacturer covered by patient's insurance.  2 times per day, insulin -dependent 08/06/23  Yes [name], MD  metFORMIN  (GLUCOPHAGE -XR) 500 MG 24 hr tablet TAKE 2 TABLETS BY MOUTH EVERY DAY WITH BREAKFAST 10/22/23  Yes [name], MD   Social History   Socioeconomic History   Marital status: Single    Spouse name: Not on file   Number of children: 1   Years of education: 12th grade   Highest education level: Never attended school  Occupational History   Occupation: [employer]  Tobacco Use   Smoking status: Never   Smokeless tobacco: Never  Vaping Use   Vaping status: Never Used  Substance and Sexual Activity   Alcohol use: Yes    Alcohol/week: 3.0 standard drinks of alcohol    Types: 3 Shots of liquor per week    Comment: 3 times per week   Drug use: Never   Sexual activity: Yes  Other Topics Concern   Not on file  Social History Narrative   Not on file   Social Drivers of Health   Tobacco Use: Low Risk (11/11/2023)   Patient History    Smoking Tobacco Use: Never    Smokeless Tobacco  Use: Never    Passive Exposure: Not on file  Financial Resource Strain: Low Risk (11/10/2023)   Overall Financial Resource Strain (CARDIA)    Difficulty of Paying Living Expenses: Not hard at all  Food Insecurity: No Food Insecurity (11/10/2023)   Epic    Worried About Programme Researcher, Broadcasting/film/video in the Last Year: Never true    Ran Out of Food in the Last Year: Never true  Transportation Needs: No Transportation Needs (11/10/2023)   Epic    Lack of Transportation (Medical): No    Lack of Transportation (Non-Medical): No  Physical Activity: Insufficiently Active (11/10/2023)   Exercise Vital Sign    Days of Exercise per Week: 2 days    Minutes of Exercise per Session: 20 min  Stress: No  Stress Concern Present (11/10/2023)   [employer] of Occupational Health - Occupational Stress Questionnaire    Feeling of Stress: Not at all  Social Connections: Moderately Isolated (11/10/2023)   Social Connection and Isolation Panel    Frequency of Communication with Friends and Family: Three times a week    Frequency of Social Gatherings with Friends and Family: Once a week    Attends Religious Services: 1 to 4 times per year    Active Member of [employer] or Organizations: No    Attends Engineer, Structural: Not on file    Marital Status: Never married  Intimate Partner Violence: Not At Risk (02/07/2023)   Humiliation, Afraid, Rape, and Kick questionnaire    Fear of Current or Ex-Partner: No    Emotionally Abused: No    Physically Abused: No    Sexually Abused: No  Depression (PHQ2-9): Low Risk (04/13/2024)   Depression (PHQ2-9)    PHQ-2 Score: 0  Alcohol Screen: Low Risk (11/10/2023)   Alcohol Screen    Last Alcohol Screening Score (AUDIT): 4  Housing: Unknown (11/10/2023)   Epic    Unable to Pay for Housing in the Last Year: No    Number of Times Moved in the Last Year: Not on file    Homeless in the Last Year: No  Utilities: Not At Risk (02/07/2023)   [facility] Utilities    Threatened with  loss of utilities: No  Health Literacy: Not on file    Review of Systems  Constitutional:  Negative for fatigue and unexpected weight change.  Eyes:  Negative for visual disturbance.  Respiratory:  Negative for cough, chest tightness and shortness of breath.   Cardiovascular:  Negative for chest pain, palpitations and leg swelling.  Gastrointestinal:  Negative for abdominal pain and blood in stool.  Neurological:  Negative for dizziness, light-headedness and headaches.     Objective:   Vitals:   04/13/24 0834  BP: 134/82  Pulse: 89  Resp: 18  Temp: 98.3 F (36.8 C)  TempSrc: Temporal  SpO2: 100%  Weight: 157 lb 12.8 oz (71.6 kg)  Height: 5' 5 (1.651 m)     Physical Exam Vitals reviewed.  Constitutional:      Appearance: He is well-developed.  HENT:     Head: Normocephalic and atraumatic.  Neck:     Vascular: No carotid bruit or JVD.  Cardiovascular:     Rate and Rhythm: Normal rate and regular rhythm.     Heart sounds: Normal heart sounds. No murmur heard. Pulmonary:     Effort: Pulmonary effort is normal.     Breath sounds: Normal breath sounds. No rales.  Musculoskeletal:     Right lower leg: No edema.     Left lower leg: No edema.  Skin:    General: Skin is warm and dry.  Neurological:     Mental Status: He is alert and oriented to person, place, and time.  Psychiatric:        Mood and Affect: Mood normal.        Assessment & Plan:  Nuri E Lipscomb is a 49 y.o. male . Type 2 diabetes mellitus with hyperglycemia, with long-term current use of insulin  (HCC) - Plan: Comprehensive metabolic panel with GFR, Hemoglobin A1c, insulin  glargine-yfgn (SEMGLEE ) 100 UNIT/ML Pen  -  Stable, tolerating current regimen. Medications refilled. Labs pending as above.   Iron  deficiency anemia, unspecified iron  deficiency anemia type - Plan: Iron , TIBC and Ferritin Panel, CBC  - Check labs  and follow-up with hematology/oncology.  Essential hypertension - Plan:  Comprehensive metabolic panel with GFR, amLODipine  (NORVASC ) 10 MG tablet  - Stable with current regimen, continue to monitor, goal of 130/80 discussed.  Borderline.  RTC precautions.  50-month follow-up  Hyperlipidemia, unspecified hyperlipidemia type - Plan: Comprehensive metabolic panel with GFR, Lipid panel, atorvastatin  (LIPITOR) 20 MG tablet  - Tolerating Lipitor, check labs and adjust plan accordingly.  Type 2 diabetes mellitus with hyperglycemia, without long-term current use of insulin  (HCC) - Plan: insulin  glargine-yfgn (SEMGLEE ) 100 UNIT/ML Pen, metFORMIN  (GLUCOPHAGE -XR) 500 MG 24 hr tablet  - As above  Meds ordered this encounter  Medications   amLODipine  (NORVASC ) 10 MG tablet    Sig: Take 1 tablet (10 mg total) by mouth daily.    Dispense:  90 tablet    Refill:  1   atorvastatin  (LIPITOR) 20 MG tablet    Sig: Take 1 tablet (20 mg total) by mouth daily.    Dispense:  90 tablet    Refill:  1   insulin  glargine-yfgn (SEMGLEE ) 100 UNIT/ML Pen    Sig: Inject 20 Units into the skin daily. Total daily dose = 0.2-0.5 units/kg/day---> 50% Basal/50% Bolus    Dispense:  15 mL    Refill:  1   metFORMIN  (GLUCOPHAGE -XR) 500 MG 24 hr tablet    Sig: Take 2 tablets (1,000 mg total) by mouth daily with breakfast.    Dispense:  180 tablet    Refill:  1   Patient Instructions  Thank you for coming in today. No change in medications at this time. If there are any concerns on your bloodwork, I will let you know. Take care!     Signed,   Reyes Pines, MD [facility], [facility] 04/13/24 9:01 AM      [1] No Known Allergies  "

## 2024-04-13 NOTE — Patient Instructions (Signed)
 Thank you for coming in today. No change in medications at this time. If there are any concerns on your bloodwork, I will let you know. Take care!

## 2024-04-14 LAB — IRON,TIBC AND FERRITIN PANEL
%SAT: 4 % — ABNORMAL LOW (ref 20–48)
Ferritin: 6 ng/mL — ABNORMAL LOW (ref 38–380)
Iron: 19 ug/dL — ABNORMAL LOW (ref 50–180)
TIBC: 510 ug/dL — ABNORMAL HIGH (ref 250–425)

## 2024-04-16 ENCOUNTER — Ambulatory Visit: Payer: Self-pay | Admitting: Family Medicine

## 2024-04-26 NOTE — Telephone Encounter (Signed)
 Copied from CRM [id number]. Topic: Clinical - Lab/Test Results >> Apr 26, 2024 12:56 PM [name] wrote: Reason for CRM: Patient returned John Obrien call regarding lab results - he said he will go on MyChart now to review and will call or message back if he has any questions.

## 2024-07-14 ENCOUNTER — Ambulatory Visit: Admitting: Family Medicine
